# Patient Record
Sex: Female | Born: 2000 | Race: Black or African American | Hispanic: No | Marital: Single | Smoking: Never smoker
Health system: Southern US, Community
[De-identification: ages and names within clinical notes are randomized; demographics above are authoritative.]

## PROBLEM LIST (undated history)

## (undated) ENCOUNTER — Inpatient Hospital Stay (HOSPITAL_COMMUNITY): Payer: Self-pay

## (undated) DIAGNOSIS — Z789 Other specified health status: Secondary | ICD-10-CM

## (undated) HISTORY — DX: Other specified health status: Z78.9

---
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
## 2021-04-18 ENCOUNTER — Other Ambulatory Visit: Payer: Self-pay | Admitting: Internal Medicine

## 2021-04-18 DIAGNOSIS — E049 Nontoxic goiter, unspecified: Secondary | ICD-10-CM

## 2021-05-06 ENCOUNTER — Ambulatory Visit
Admission: RE | Admit: 2021-05-06 | Discharge: 2021-05-06 | Disposition: A | Discharge status: Home / Self Care | Payer: Medicaid Other | Source: Ambulatory Visit | Attending: Internal Medicine | Admitting: Internal Medicine

## 2021-05-06 DIAGNOSIS — E049 Nontoxic goiter, unspecified: Secondary | ICD-10-CM

## 2021-08-10 ENCOUNTER — Emergency Department (HOSPITAL_COMMUNITY): Payer: Medicaid Other

## 2021-08-10 ENCOUNTER — Encounter (HOSPITAL_COMMUNITY): Payer: Self-pay | Admitting: Emergency Medicine

## 2021-08-10 ENCOUNTER — Emergency Department (HOSPITAL_COMMUNITY)
Admission: EM | Admit: 2021-08-10 | Discharge: 2021-08-10 | Disposition: A | Discharge status: Home / Self Care | Payer: Medicaid Other | Attending: Emergency Medicine | Admitting: Emergency Medicine

## 2021-08-10 ENCOUNTER — Other Ambulatory Visit: Payer: Self-pay

## 2021-08-10 DIAGNOSIS — X509XXA Other and unspecified overexertion or strenuous movements or postures, initial encounter: Secondary | ICD-10-CM | POA: Insufficient documentation

## 2021-08-10 DIAGNOSIS — S29011A Strain of muscle and tendon of front wall of thorax, initial encounter: Secondary | ICD-10-CM | POA: Diagnosis not present

## 2021-08-10 DIAGNOSIS — Y9364 Activity, baseball: Secondary | ICD-10-CM | POA: Diagnosis not present

## 2021-08-10 DIAGNOSIS — T148XXA Other injury of unspecified body region, initial encounter: Secondary | ICD-10-CM

## 2021-08-10 DIAGNOSIS — S299XXA Unspecified injury of thorax, initial encounter: Secondary | ICD-10-CM | POA: Diagnosis present

## 2021-08-10 DIAGNOSIS — Y92320 Baseball field as the place of occurrence of the external cause: Secondary | ICD-10-CM | POA: Diagnosis not present

## 2021-08-10 LAB — POC URINE PREG, ED: Preg Test, Ur: NEGATIVE

## 2021-08-10 NOTE — ED Provider Notes (Signed)
?[facility] EMERGENCY DEPARTMENT ?Provider Note ? ? ?CSN: [id number] ?Arrival date & time: 08/10/21  1647 ? ?  ? ?History ? ?Chief Complaint  ?Patient presents with  ? Back Pain  ? ? ?Ariel Golden is a 21 y.o. female. ? ?HPI ?She complains of left lower rib pain present for 2 days, after taking some swings in softball practice.  She also thinks she might of injured her ribs when she jumped and dove to catch a ball last week.  2 days ago she felt a pop as she was swinging. ?  ? ?Home Medications ?Prior to Admission medications   ?Medication Sig Start Date End Date Taking? Authorizing Provider  ?cyclobenzaprine (FLEXERIL) 5 MG tablet Take 1 tablet (5 mg total) by mouth 3 (three) times daily as needed for muscle spasms. ?Patient not taking: Reported on 10/07/2019 01/02/16   Lowanda Foster, NP  ?ibuprofen (ADVIL,MOTRIN) 600 MG tablet Take 1 Tab PO Q6H x 1-2 days then Q6H PRN pain ?Patient not taking: Reported on 10/07/2019 01/02/16   Lowanda Foster, NP  ?   ? ?Allergies    ?Patient has no known allergies.   ? ?Review of Systems   ?Review of Systems ? ?Physical Exam ?Updated Vital Signs ?BP 124/82 (BP Location: Right Arm)   Pulse 84   Temp 98.2 ?F (36.8 ?C)   Resp 14   SpO2 99%  ?Physical Exam ?Vitals and nursing note reviewed.  ?Constitutional:   ?   Appearance: She is well-developed. She is not ill-appearing.  ?HENT:  ?   Head: Normocephalic and atraumatic.  ?   Right Ear: External ear normal.  ?   Left Ear: External ear normal.  ?Eyes:  ?   Conjunctiva/sclera: Conjunctivae normal.  ?   Pupils: Pupils are equal, round, and reactive to light.  ?Neck:  ?   Trachea: Phonation normal.  ?Cardiovascular:  ?   Rate and Rhythm: Normal rate.  ?Pulmonary:  ?   Effort: Pulmonary effort is normal.  ?Chest:  ?   Chest wall: Tenderness (Mild tenderness left rib, #10, without crepitation or deformity.) present.  ?Abdominal:  ?   Palpations: Abdomen is soft.  ?   Tenderness: There is no abdominal tenderness.   ?Musculoskeletal:     ?   General: Normal range of motion.  ?   Cervical back: Normal range of motion and neck supple.  ?   Comments: No tenderness of the lumbar spine or paraspinal muscles in the lumbar region.  ?Skin: ?   General: Skin is warm and dry.  ?Neurological:  ?   Mental Status: She is alert and oriented to person, place, and time.  ?   Cranial Nerves: No cranial nerve deficit.  ?   Sensory: No sensory deficit.  ?   Motor: No abnormal muscle tone.  ?   Coordination: Coordination normal.  ?Psychiatric:     ?   Mood and Affect: Mood normal.     ?   Behavior: Behavior normal.     ?   Thought Content: Thought content normal.     ?   Judgment: Judgment normal.  ? ? ?ED Results / Procedures / Treatments   ?Labs ?(all labs ordered are listed, but only abnormal results are displayed) ?Labs Reviewed  ?POC URINE PREG, ED  ? ? ?EKG ?None ? ?Radiology ?DG Ribs Unilateral W/Chest Left ? ?Result Date: 08/10/2021 ?CLINICAL DATA:  Back pain for 2-3 days on the left, initial encounter EXAM: LEFT RIBS AND  CHEST - 3+ VIEW COMPARISON:  10/07/2019 FINDINGS: Cardiac shadow is within normal limits. The lungs are clear bilaterally. No focal infiltrate, effusion or pneumothorax is seen. No acute rib abnormality noted. IMPRESSION: No acute abnormality noted. Electronically Signed   By: Alcide Clever M.D.   On: 08/10/2021 22:53  ? ?DG Lumbar Spine 2-3 Views ? ?Result Date: 08/10/2021 ?CLINICAL DATA:  Injury, back pain for 2-3 days. EXAM: LUMBAR SPINE - 2-3 VIEW COMPARISON:  None. FINDINGS: There is no evidence of lumbar spine fracture. Alignment is normal. Intervertebral disc spaces are maintained. IMPRESSION: Negative. Electronically Signed   By: Thornell Sartorius M.D.   On: 08/10/2021 22:14   ? ?Procedures ?Procedures  ? ? ?Medications Ordered in ED ?Medications - No data to display ? ?ED Course/ Medical Decision Making/ A&P ?  ?                        ?Medical Decision Making ?Patient presenting with musculoskeletal type pain after 2  injuries, while playing ball.  1 was a diving injury and the other was a sweet injury. ? ?Problems Addressed: ?Muscle strain: undiagnosed new problem with uncertain prognosis ?   Details: Possible fracture, imaging required ? ?Amount and/or Complexity of Data Reviewed ?Independent Historian:  ?   Details: She is a cogent historian ?Radiology: ordered and independent interpretation performed. ?   Details: Chest x-ray with left rib detail, lumbosacral spine-no acute abnormalities ? ?Risk ?Decision regarding hospitalization. ?Risk Details: Patient with minor pain, presenting requiring imaging to evaluate for fractures.  No significant injury noted to chest, left ribs or lumbar spine.  She can be treated symptomatically and expectantly and return to regular activities as tolerated.  No indication for hospitalization at this time. ? ? ? ? ? ? ? ? ? ? ?Final Clinical Impression(s) / ED Diagnoses ?Final diagnoses:  ?Muscle strain  ? ? ?Rx / DC Orders ?ED Discharge Orders   ? ? None  ? ?  ? ? ?  ?Mancel Bale, MD ?08/10/21 2327 ? ?

## 2021-08-10 NOTE — ED Triage Notes (Signed)
Pt c/o back pain x2-3 days, worse after softball practice, thinks she pulled a muscle. States it's spasming, she can't turn or bend down all the way.  ?

## 2021-08-10 NOTE — Discharge Instructions (Signed)
There are no apparent injuries to the ribs or lower back.  You likely have a muscle strain causing the discomfort.  Avoid sporting activities until the pain resolves.  You can try treating the pain with heat application such as a heating pad, 3-4 times a day for 20 minutes.  Also use Tylenol, or Motrin for pain.  See your doctor if not better in a few days. ?

## 2022-05-24 ENCOUNTER — Encounter: Payer: Self-pay | Admitting: General Practice

## 2022-07-14 ENCOUNTER — Encounter: Payer: Medicaid Other | Admitting: Obstetrics and Gynecology

## 2022-08-14 ENCOUNTER — Other Ambulatory Visit (HOSPITAL_COMMUNITY)
Admission: RE | Admit: 2022-08-14 | Discharge: 2022-08-14 | Disposition: A | Discharge status: Home / Self Care | Payer: Medicaid Other | Source: Ambulatory Visit | Attending: Obstetrics and Gynecology | Admitting: Obstetrics and Gynecology

## 2022-08-14 ENCOUNTER — Encounter: Payer: Self-pay | Admitting: Obstetrics and Gynecology

## 2022-08-14 ENCOUNTER — Ambulatory Visit (INDEPENDENT_AMBULATORY_CARE_PROVIDER_SITE_OTHER): Payer: Medicaid Other | Admitting: Obstetrics and Gynecology

## 2022-08-14 VITALS — BP 112/67 | HR 62 | Ht 65.0 in | Wt 178.0 lb

## 2022-08-14 DIAGNOSIS — Z113 Encounter for screening for infections with a predominantly sexual mode of transmission: Secondary | ICD-10-CM

## 2022-08-14 DIAGNOSIS — R87612 Low grade squamous intraepithelial lesion on cytologic smear of cervix (LGSIL): Secondary | ICD-10-CM

## 2022-08-14 NOTE — Progress Notes (Signed)
   NEW GYNECOLOGY PATIENT Patient name: Ariel Golden MRN [id number]  Date of birth: Apr 20, 2001 Chief Complaint:   Abnormal Pap Smear     History:  Ariel Golden is a 22 y.o. . being seen today for abnormal pap smear. No prior pap smear collected. No abnormal symptoms. Previously sexually active. Has a few questions regarding fertility as well - told that many cousins have had issues with abnormal pap smears and fertility.      Gynecologic History No LMP recorded. Contraception: none Last Pap: LSIL  Last Mammogram: n/a Last Colonoscopy: n/a  Obstetric History OB History  No obstetric history on file.    No past medical history on file.  No past surgical history on file.  Current Outpatient Medications on File Prior to Visit  Medication Sig Dispense Refill   cyclobenzaprine (FLEXERIL) 5 MG tablet Take 1 tablet (5 mg total) by mouth 3 (three) times daily as needed for muscle spasms. (Patient not taking: Reported on 10/07/2019) 9 tablet 0   ibuprofen (ADVIL,MOTRIN) 600 MG tablet Take 1 Tab PO Q6H x 1-2 days then Q6H PRN pain (Patient not taking: Reported on 10/07/2019) 30 tablet 0   No current facility-administered medications on file prior to visit.    No Known Allergies  Social History:  reports that she has never smoked. She has never used smokeless tobacco.  No family history on file.  The following portions of the patient's history were reviewed and updated as appropriate: allergies, current medications, past family history, past medical history, past social history, past surgical history and problem list.  Review of Systems Pertinent items noted in HPI and remainder of comprehensive ROS otherwise negative.  Physical Exam:  BP 112/67   Pulse 62   Ht 5\' 5"  (1.651 m)   Wt 178 lb (80.7 kg)   BMI 29.62 kg/m  Physical Exam Vitals and nursing note reviewed.  Constitutional:      Appearance: Normal appearance.  Cardiovascular:     Rate and Rhythm: Normal rate.   Pulmonary:     Effort: Pulmonary effort is normal.     Breath sounds: Normal breath sounds.  Neurological:     General: No focal deficit present.     Mental Status: She is alert and oriented to person, place, and time.  Psychiatric:        Mood and Affect: Mood normal.        Behavior: Behavior normal.        Thought Content: Thought content normal.        Judgment: Judgment normal.        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 Reviewed indication of cervical cancer screening and role of HPV testing.   2. Screening for STD (sexually transmitted disease) Requested STI testing, will follow up results and treat as indicated.  - GC/Chlamydia probe amp ([place])not at [facility] - RPR+HBsAg+HCVAb+...    Routine preventative health maintenance measures emphasized. Please refer to After Visit Summary for other counseling recommendations.   Follow-up: No follow-ups on file.      Ariel Shire, MD Obstetrician & Gynecologist, Faculty Practice [facility] for [employer], [facility]

## 2022-08-15 ENCOUNTER — Other Ambulatory Visit: Payer: Self-pay | Admitting: Obstetrics and Gynecology

## 2022-08-15 ENCOUNTER — Telehealth: Payer: Self-pay

## 2022-08-15 DIAGNOSIS — A749 Chlamydial infection, unspecified: Secondary | ICD-10-CM

## 2022-08-15 LAB — GC/CHLAMYDIA PROBE AMP (~~LOC~~) NOT AT ARMC
Chlamydia: POSITIVE — AB
Comment: NEGATIVE
Comment: NORMAL
Neisseria Gonorrhea: NEGATIVE

## 2022-08-15 LAB — RPR+HBSAG+HCVAB+...
HIV Screen 4th Generation wRfx: NONREACTIVE
Hep C Virus Ab: NONREACTIVE
Hepatitis B Surface Ag: NEGATIVE
RPR Ser Ql: NONREACTIVE

## 2022-08-15 MED ORDER — DOXYCYCLINE HYCLATE 100 MG PO CAPS: 100.0000 mg | ORAL_CAPSULE | Freq: Two times a day (BID) | ORAL | 1 refills | Status: AC

## 2022-08-15 NOTE — Telephone Encounter (Signed)
Patient notified that labwork was all within normal limits. Armandina Stammer RN

## 2022-08-15 NOTE — Telephone Encounter (Signed)
-----   Message from Lorriane Shire, MD sent at 08/15/2022  8:03 AM EDT ----- Notify of negative serum STI tests

## 2022-08-16 ENCOUNTER — Telehealth: Payer: Self-pay

## 2022-08-16 NOTE — Telephone Encounter (Signed)
Attempted to reach patient and voicemailbox not set up. Breona Cherubin RN  ?

## 2022-08-16 NOTE — Telephone Encounter (Signed)
-----   Message from Lorriane Shire, MD sent at 08/15/2022  3:59 PM EDT ----- Notify pt of chlamydia. RX's sent. Advised of need for partner TX and no unprotected sex for 7 days after TX.

## 2022-08-17 NOTE — Telephone Encounter (Signed)
Patient called and verified by name and dob. Patient given results and made aware that she has rx for doxycycline to pick up at her pharmacy. Made aware she should take full regimen and abstain from any intercourse for 14 days from when she starts her treatment. Patient scheduled for TOC in May. Armandina Stammer RN

## 2022-08-20 IMAGING — CR DG RIBS W/ CHEST 3+V*L*
3 series · 3 of 3 positions shown · non-contrast
Comparison: 10/07/2019

CLINICAL DATA: Back pain for 2-3 days on the left, initial
encounter

EXAM:
LEFT RIBS AND CHEST - 3+ VIEW

[chest pa]
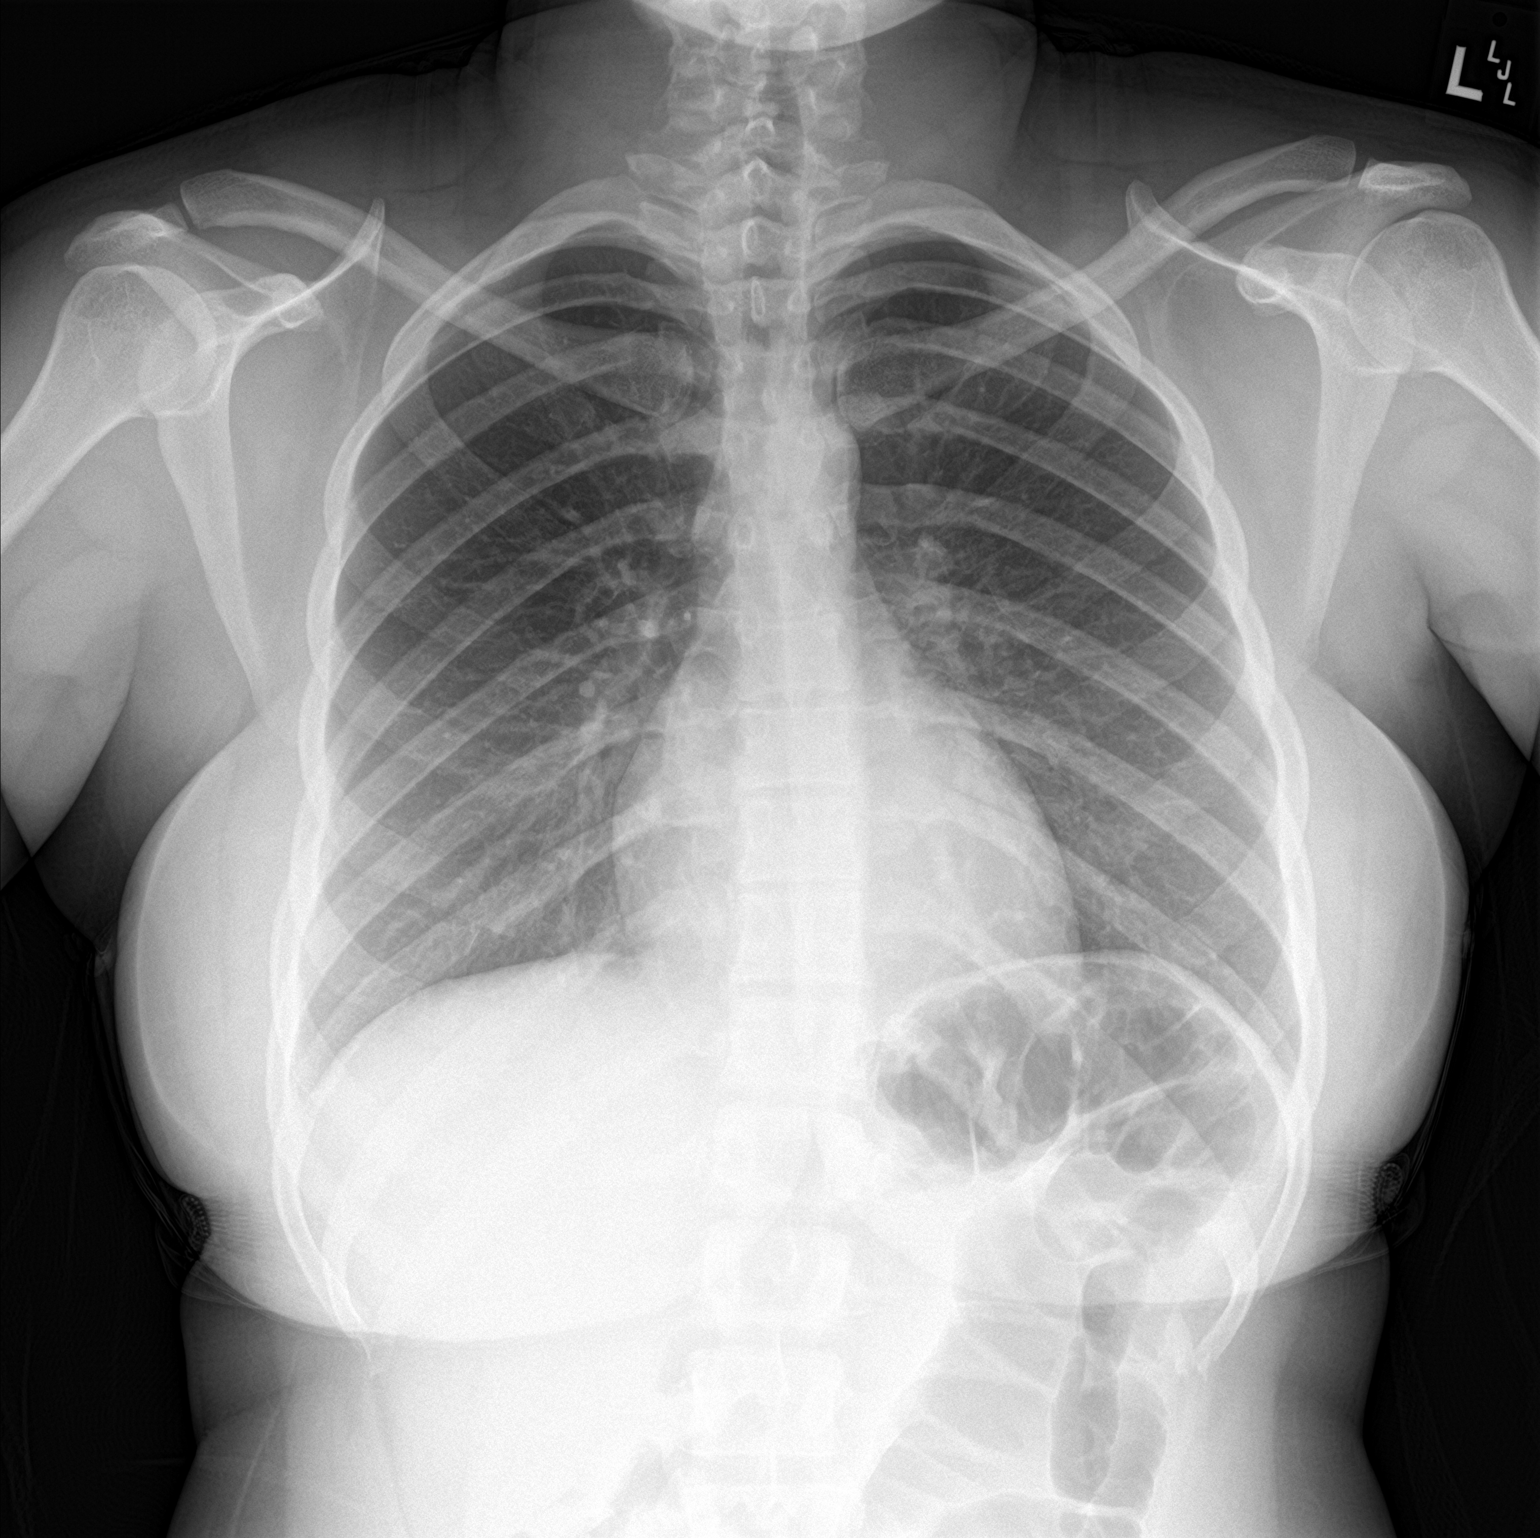

[rib ap]
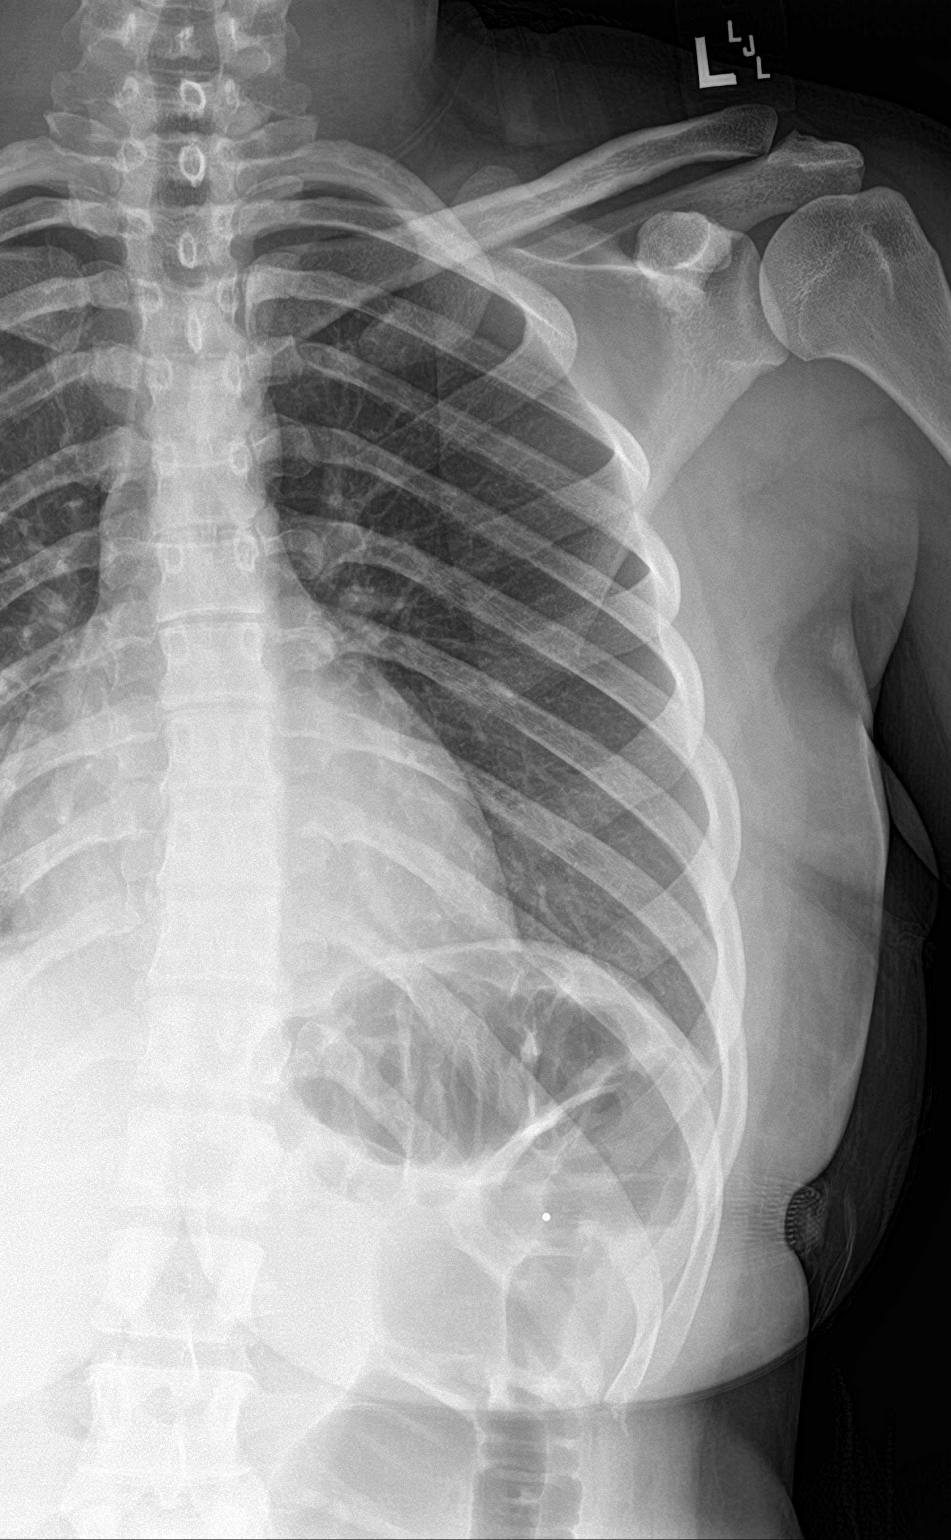

[rib ap obl]
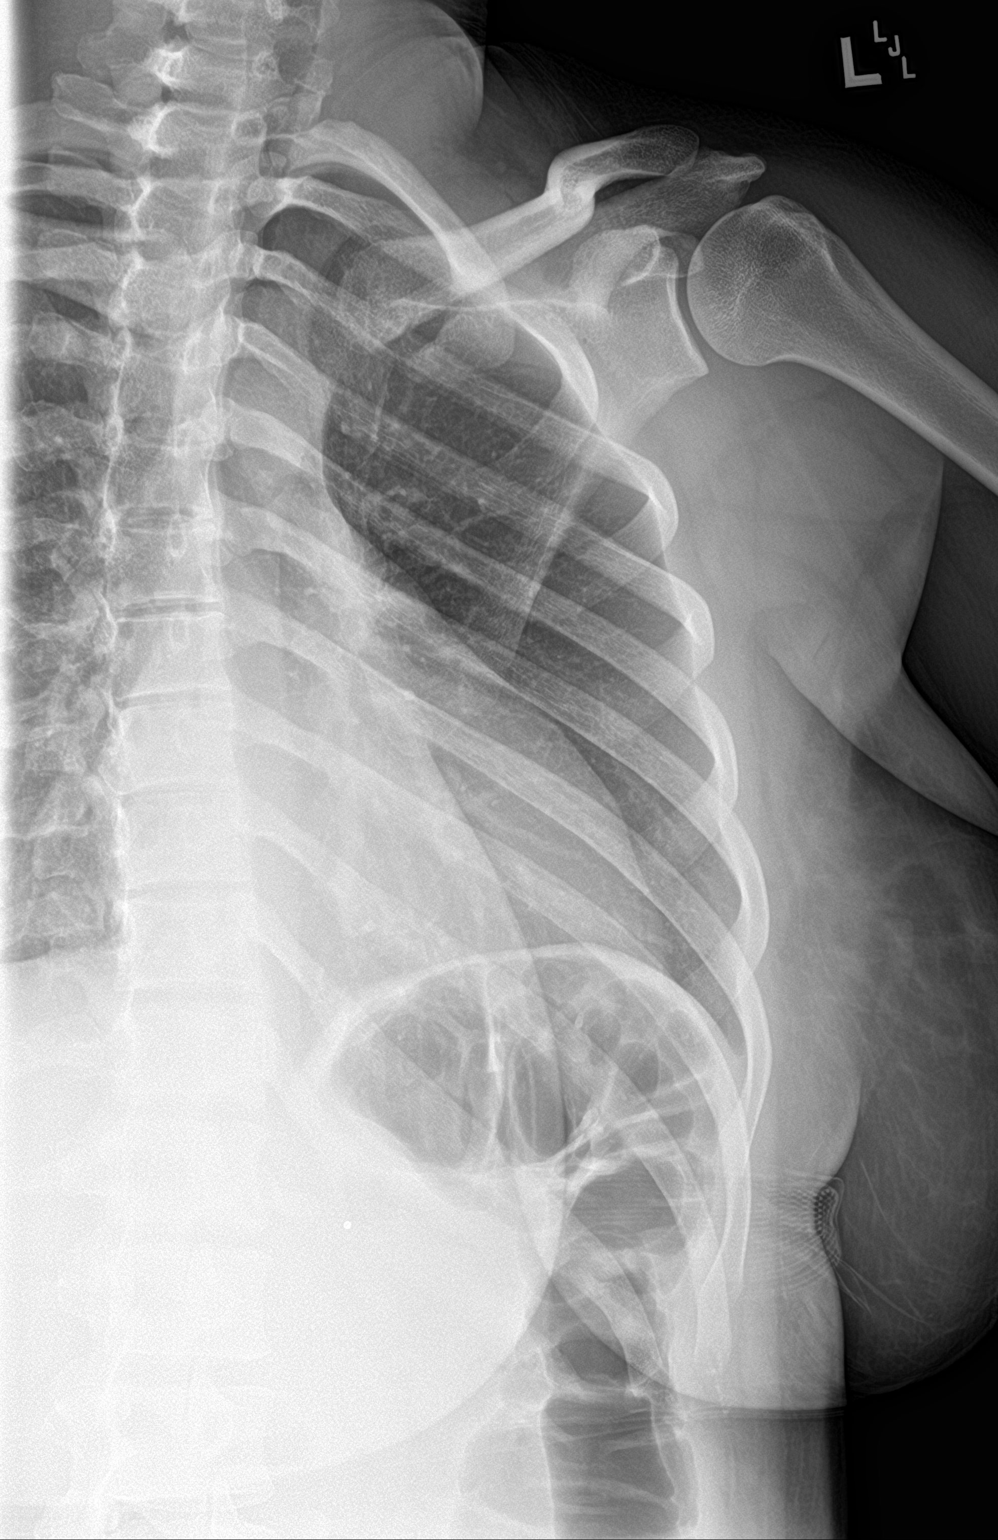

[3 of 3 positions shown; findings below may reference images not displayed]

FINDINGS: Cardiac shadow is within normal limits. The lungs are clear
bilaterally. No focal infiltrate, effusion or pneumothorax is seen.
No acute rib abnormality noted.
IMPRESSION: No acute abnormality noted.

## 2022-09-11 ENCOUNTER — Ambulatory Visit: Payer: Medicaid Other

## 2022-09-18 ENCOUNTER — Ambulatory Visit (INDEPENDENT_AMBULATORY_CARE_PROVIDER_SITE_OTHER): Payer: Medicaid Other

## 2022-09-18 ENCOUNTER — Other Ambulatory Visit (HOSPITAL_COMMUNITY)
Admission: RE | Admit: 2022-09-18 | Discharge: 2022-09-18 | Disposition: A | Discharge status: Home / Self Care | Payer: Medicaid Other | Source: Ambulatory Visit | Attending: Obstetrics and Gynecology | Admitting: Obstetrics and Gynecology

## 2022-09-18 VITALS — BP 117/62 | HR 80 | Ht 65.0 in | Wt 173.0 lb

## 2022-09-18 DIAGNOSIS — Z8619 Personal history of other infectious and parasitic diseases: Secondary | ICD-10-CM | POA: Insufficient documentation

## 2022-09-18 DIAGNOSIS — Z113 Encounter for screening for infections with a predominantly sexual mode of transmission: Secondary | ICD-10-CM

## 2022-09-18 NOTE — Progress Notes (Unsigned)
Patient presents for test of cure from chlamydia. Patient was treated on 08/15/2022. Patient given instructions to collect self swab. Armandina Stammer RN

## 2022-09-19 LAB — CERVICOVAGINAL ANCILLARY ONLY
Bacterial Vaginitis (gardnerella): POSITIVE — AB
Candida Glabrata: NEGATIVE
Candida Vaginitis: NEGATIVE
Chlamydia: NEGATIVE
Comment: NEGATIVE
Comment: NEGATIVE
Comment: NEGATIVE
Comment: NEGATIVE
Comment: NEGATIVE
Comment: NORMAL
Neisseria Gonorrhea: NEGATIVE
Trichomonas: NEGATIVE

## 2022-09-20 ENCOUNTER — Telehealth: Payer: Self-pay

## 2022-09-20 ENCOUNTER — Other Ambulatory Visit: Payer: Self-pay | Admitting: Obstetrics and Gynecology

## 2022-09-20 DIAGNOSIS — B9689 Other specified bacterial agents as the cause of diseases classified elsewhere: Secondary | ICD-10-CM

## 2022-09-20 MED ORDER — METRONIDAZOLE 500 MG PO TABS: 500.0000 mg | ORAL_TABLET | Freq: Two times a day (BID) | ORAL | 0 refills | Status: AC

## 2022-09-20 NOTE — Telephone Encounter (Signed)
Called patient to inform her that she tested positive for BV and all blood work and GC/Chlamydia labs are negative. Pt also made aware that Flagyl 500 mg 1 tablet PO BID was sent to her pharmacy. Advised patient to drink while taking this medication because it can make her sick. Understanding was voiced. Suzanne Kho l Viren Lebeau, CMA

## 2022-11-13 ENCOUNTER — Other Ambulatory Visit (HOSPITAL_COMMUNITY)
Admission: RE | Admit: 2022-11-13 | Discharge: 2022-11-13 | Disposition: A | Discharge status: Home / Self Care | Payer: Medicaid Other | Source: Ambulatory Visit | Attending: Obstetrics and Gynecology | Admitting: Obstetrics and Gynecology

## 2022-11-13 ENCOUNTER — Ambulatory Visit: Payer: Medicaid Other | Admitting: Obstetrics and Gynecology

## 2022-11-13 ENCOUNTER — Encounter: Payer: Self-pay | Admitting: Obstetrics and Gynecology

## 2022-11-13 VITALS — BP 116/60 | HR 67 | Wt 186.0 lb

## 2022-11-13 DIAGNOSIS — Z113 Encounter for screening for infections with a predominantly sexual mode of transmission: Secondary | ICD-10-CM | POA: Diagnosis present

## 2022-11-13 DIAGNOSIS — N946 Dysmenorrhea, unspecified: Secondary | ICD-10-CM | POA: Diagnosis not present

## 2022-11-13 NOTE — Progress Notes (Signed)
    GYNECOLOGY VISIT  Patient name: Ariel Golden MRN [id number]  Date of birth: 2001/02/20 Chief Complaint:   Dysmenorrhea  History:  Ariel Golden is a 22 y.o. No obstetric history on file. being seen today for worsened menstrual pain. Last cycle had more significant than usual. Had not taken her advil that day since it was during the last 2 days of her cycle and typically doesn't have pain at that point. 30 - 40 minutes of pain that was like a stabbing pain and she was recommended by mother to be evaluated. No continued pain since menses has ceased.  No dysuria, no dyschezia, no pain outside of cycles, not currently sexually active. May have to miss class when not feeling well. Not intersted in OCP at this point. Typically has improvement of pain with advil.   No past medical history on file.  No past surgical history on file.  The following portions of the patient's history were reviewed and updated as appropriate: allergies, current medications, past family history, past medical history, past social history, past surgical history and problem list.   Health Maintenance:   Last pap 03/2022 LSIL(recommended repeat in 1 year) Last mammogram: n/a   Review of Systems:  Pertinent items are noted in HPI. Comprehensive review of systems was otherwise negative.   Objective:  Physical Exam BP 116/60   Pulse 67   Wt 186 lb (84.4 kg)   LMP 11/10/2022 (Exact Date)   BMI 30.95 kg/m    Physical Exam Vitals and nursing note reviewed.  Constitutional:      Appearance: Normal appearance.  HENT:     Head: Normocephalic and atraumatic.  Pulmonary:     Effort: Pulmonary effort is normal.  Abdominal:     Palpations: Abdomen is soft.     Tenderness: There is no abdominal tenderness.  Skin:    General: Skin is warm and dry.  Neurological:     General: No focal deficit present.     Mental Status: She is alert.  Psychiatric:        Mood and Affect: Mood normal.        Behavior:  Behavior normal.        Thought Content: Thought content normal.        Judgment: Judgment normal.       Assessment & Plan:   1. Dysmenorrhea Suspect primary dysmenorrhea. Discussed TVUS to evaluate for structural contribution to pain including cysts as well as to have baseline [place] on file. Offered menstrual suppression, declines at this time. Discussed use of NSAIDs scheduled starting day prior to onset of menses to help decrease pain.  - US PELVIC COMPLETE WITH TRANSVAGINAL; Future  2. Screening examination for STI Prior swab with BV and requested full repeat testing today.  - Cervicovaginal ancillary only - RPR+HBsAg+HCVAb+...   Routine preventative health maintenance measures emphasized.  Ariel Shire, MD [facility] for [facility], [facility]

## 2022-11-14 LAB — CERVICOVAGINAL ANCILLARY ONLY
Bacterial Vaginitis (gardnerella): POSITIVE — AB
Candida Glabrata: NEGATIVE
Candida Vaginitis: NEGATIVE
Chlamydia: NEGATIVE
Comment: NEGATIVE
Comment: NEGATIVE
Comment: NEGATIVE
Comment: NEGATIVE
Comment: NEGATIVE
Comment: NORMAL
Neisseria Gonorrhea: NEGATIVE
Trichomonas: NEGATIVE

## 2022-11-14 LAB — RPR+HBSAG+HCVAB+...
HIV Screen 4th Generation wRfx: NONREACTIVE
Hep C Virus Ab: NONREACTIVE
Hepatitis B Surface Ag: NEGATIVE
RPR Ser Ql: NONREACTIVE

## 2022-11-16 ENCOUNTER — Telehealth: Payer: Self-pay

## 2022-11-16 ENCOUNTER — Ambulatory Visit (HOSPITAL_BASED_OUTPATIENT_CLINIC_OR_DEPARTMENT_OTHER)
Admission: RE | Admit: 2022-11-16 | Discharge: 2022-11-16 | Disposition: A | Discharge status: Home / Self Care | Payer: Medicaid Other | Source: Ambulatory Visit | Attending: Obstetrics and Gynecology | Admitting: Obstetrics and Gynecology

## 2022-11-16 ENCOUNTER — Other Ambulatory Visit: Payer: Self-pay | Admitting: Obstetrics and Gynecology

## 2022-11-16 DIAGNOSIS — N946 Dysmenorrhea, unspecified: Secondary | ICD-10-CM | POA: Insufficient documentation

## 2022-11-16 DIAGNOSIS — B9689 Other specified bacterial agents as the cause of diseases classified elsewhere: Secondary | ICD-10-CM

## 2022-11-16 MED ORDER — METRONIDAZOLE 0.75 % VA GEL: 1.0000 | Freq: Every day | VAGINAL | 1 refills | Status: DC

## 2022-11-16 NOTE — Telephone Encounter (Signed)
-----   Message from Lorriane Shire sent at 11/16/2022  1:35 PM EDT ----- Notify of normal ultrasound and blood work. Vaginal swab shows persistent BV and metrogel sent to pharmacy

## 2022-11-16 NOTE — Telephone Encounter (Signed)
Called patient to inform her that her [place] and blood work is normal. She tested positive for BV and Metrogel was sent to her pharmacy. Understanding was voiced. Myndi Wamble l Jamaree Hosier, CMA

## 2023-04-20 ENCOUNTER — Encounter: Payer: Self-pay | Admitting: Obstetrics and Gynecology

## 2023-04-20 ENCOUNTER — Other Ambulatory Visit (HOSPITAL_COMMUNITY)
Admission: RE | Admit: 2023-04-20 | Discharge: 2023-04-20 | Disposition: A | Discharge status: Home / Self Care | Payer: Medicaid Other | Source: Ambulatory Visit | Attending: Obstetrics and Gynecology | Admitting: Obstetrics and Gynecology

## 2023-04-20 ENCOUNTER — Ambulatory Visit: Payer: Medicaid Other | Admitting: Obstetrics and Gynecology

## 2023-04-20 ENCOUNTER — Ambulatory Visit (INDEPENDENT_AMBULATORY_CARE_PROVIDER_SITE_OTHER): Payer: Medicaid Other

## 2023-04-20 VITALS — BP 128/69 | HR 77 | Ht 65.0 in | Wt 182.0 lb

## 2023-04-20 DIAGNOSIS — Z113 Encounter for screening for infections with a predominantly sexual mode of transmission: Secondary | ICD-10-CM | POA: Diagnosis present

## 2023-04-20 NOTE — Progress Notes (Signed)
SUBJECTIVE:  22 y.o. female who desires a STI screen. Denies abnormal vaginal discharge, bleeding or significant pelvic pain. No UTI symptoms. Denies history of known exposure to STD.  Patient's last menstrual period was 04/02/2023 (approximate).  OBJECTIVE:  She appears well.   ASSESSMENT:  STI Screen   PLAN:  Pt offered STI blood screening-requested GC, chlamydia, and trichomonas probe sent to lab.  Treatment: To be determined once lab results are received.  Pt follow up as needed.

## 2023-04-23 NOTE — Progress Notes (Signed)
Cancelled appointment

## 2023-04-24 LAB — CERVICOVAGINAL ANCILLARY ONLY
Chlamydia: NEGATIVE
Comment: NEGATIVE
Comment: NEGATIVE
Comment: NORMAL
Neisseria Gonorrhea: NEGATIVE
Trichomonas: NEGATIVE

## 2023-06-12 ENCOUNTER — Ambulatory Visit: Payer: Medicaid Other

## 2023-06-12 ENCOUNTER — Other Ambulatory Visit (HOSPITAL_COMMUNITY)
Admission: RE | Admit: 2023-06-12 | Discharge: 2023-06-12 | Disposition: A | Discharge status: Home / Self Care | Payer: Medicaid Other | Source: Ambulatory Visit

## 2023-06-12 VITALS — BP 126/72 | HR 72 | Ht 65.0 in | Wt 189.0 lb

## 2023-06-12 DIAGNOSIS — Z1239 Encounter for other screening for malignant neoplasm of breast: Secondary | ICD-10-CM

## 2023-06-12 DIAGNOSIS — Z1151 Encounter for screening for human papillomavirus (HPV): Secondary | ICD-10-CM

## 2023-06-12 DIAGNOSIS — R300 Dysuria: Secondary | ICD-10-CM

## 2023-06-12 DIAGNOSIS — Z113 Encounter for screening for infections with a predominantly sexual mode of transmission: Secondary | ICD-10-CM | POA: Diagnosis not present

## 2023-06-12 DIAGNOSIS — Z124 Encounter for screening for malignant neoplasm of cervix: Secondary | ICD-10-CM

## 2023-06-12 DIAGNOSIS — Z01419 Encounter for gynecological examination (general) (routine) without abnormal findings: Secondary | ICD-10-CM | POA: Diagnosis present

## 2023-06-12 DIAGNOSIS — R87612 Low grade squamous intraepithelial lesion on cytologic smear of cervix (LGSIL): Secondary | ICD-10-CM

## 2023-06-12 LAB — POCT URINALYSIS DIPSTICK

## 2023-06-12 NOTE — Progress Notes (Signed)
GYNECOLOGY OFFICE VISIT NOTE-WELL WOMAN EXAM  History:   Ariel Golden is a 23 year old here today for "pap smear and annual."    LMP was May 31, 2023. She reports normal, for her is, "heavy cramping and heavy bleeding that dies down the last 2-3 days."  She reports periods lasts "full 7 days."  She reports passing some clots "thumb nail" sized clots.  She reports taking Advil for the cramping with relief. She denies any abnormal vaginal discharge, bleeding, or pelvic pain.   Birth Control:  None. Not desired currently  Reproductive Concerns Sexually Active: Not currently. Last incident October Partners Type: Female Number of partners in last year: Two; Condom usage. Reports some condom breakage STD Testing: Full  Obstetrical History: No obstetric history on file.  Gynecological History: She reports having abnormal pap smear in Dec 2023. She is unsure if she received follow up. Vaginal/GU Concerns: She denies issues with urination, constipation, or diarrhea. However, she does report burning with urination that is worse during her menstrual cycle, but always present. She reports symptoms have been present since December.  Breast Concerns/Exams: She checks breast regularly and endorses SBA. She reports maternal cousin is breast CA survivor. She denies other family history of breast, uterine, cervical, or ovarian cancer  Medical and Nutrition PCP: [facility]-Last appt last month Significant PMx: Anemia Exercise: Daily Walking 1.5 hrs. Attempts to do 3 miles.  Tobacco/Drugs/Alcohol/Vaping: Drinks "every once in a blue moon." Usually mixed drinks.  Nutrition: Attempts to eat balanced  Social Safety at home: Lives with mom, aunt, grandma, and female cousin. Endorses safety.  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  No past surgical  history on file.  The following portions of the patient's history were reviewed and updated as appropriate: allergies, current medications, past family history, past medical history, past social history, past surgical history and problem list.   Health Maintenance: Pap: Pending .  Mammogram: N/A.  Colonoscopy: N/A Review of Systems:  Pertinent items noted in HPI and remainder of comprehensive ROS otherwise negative.    Objective:    Physical Exam BP 126/72   Pulse 72   Ht 5\' 5"  (1.651 m)   Wt 189 lb (85.7 kg)   LMP 05/31/2023   BMI 31.45 kg/m  Physical Exam Vitals reviewed. Exam conducted with a chaperone present [place], [place]).  Constitutional:      Appearance: Normal appearance.  HENT:     Head: Normocephalic and atraumatic.  Eyes:     Conjunctiva/sclera: Conjunctivae normal.  Cardiovascular:     Rate and Rhythm: Normal rate and regular rhythm.     Heart sounds: Normal heart sounds.  Pulmonary:     Effort: Pulmonary effort is normal. No respiratory distress.     Breath sounds: Normal breath sounds.  Chest:  Breasts:    Right: No mass, nipple discharge, skin change or tenderness.     Left: No mass, nipple discharge, skin change or tenderness.     Comments: CBE completed and normal Abdominal:     General: Bowel sounds are normal.     Palpations: Abdomen is soft.     Tenderness: There is no abdominal tenderness.  Genitourinary:    General: Normal vulva.     Labia:        Right: No tenderness or lesion.        Left: No  tenderness or lesion.      Vagina: Vaginal discharge (Milky white, ? curdy consistenty) present.     Cervix: Discharge present.     Uterus: Normal. Not enlarged and not tender.      Comments: Pap collected with broom and spatula. Cervix with some scant bleeding/mucoid discharge after collection.  No apparent uterine tenderness or enlargement. Adnexa not appreciated.  Musculoskeletal:        General: Normal range of motion.     Cervical back: Normal  range of motion.  Skin:    General: Skin is warm and dry.     Comments: Tattoos noted  Neurological:     Mental Status: She is alert and oriented to person, place, and time.  Psychiatric:        Mood and Affect: Mood normal.        Behavior: Behavior normal.      Labs and Imaging No results found for this or any previous visit (from the past week). No results found.   Assessment & Plan:  23 year old Female Well Woman Exam with Pap STD Testing Dysuria  1. Well woman exam with routine gynecological exam -Exam performed and findings discussed. -Encouraged to activate and utilize Mychart for reviewing of results, communication with office, and scheduling of appts. -Encouraged to continue exercise regimen.  Discussed adding strength training for muscle strength/support.    2. Pap smear for cervical cancer screening -History of LSIL. -Pap collected today. -Educated on ASCCP guidelines regarding pap smear evaluation and frequency. -Discussed how management will be based on pending results. -Informed of turnover time and provider/clinic policy on releasing results.  3. Screening examination for STD (sexually transmitted disease) -CV collected. -Will treat accordingly.   4. Burning with urination -Urine dip negative. -Will send for culture to r/o bacteruria.   5. Screening breast examination -CBE completed and normal. -Educated and encouraged to continue SBE with increased breast awareness including examination of breast for skin changes, moles, tenderness, etc.  -Information regarding breast awareness placed in AVS. -Encouraged to contact office with changes or concerns.    Routine preventative health maintenance measures emphasized. Please refer to After Visit Summary for other counseling recommendations.   No follow-ups on file.      Cherre Robins, CNM 06/12/2023

## 2023-06-13 LAB — CERVICOVAGINAL ANCILLARY ONLY
Bacterial Vaginitis (gardnerella): NEGATIVE
Candida Glabrata: NEGATIVE
Candida Vaginitis: NEGATIVE
Chlamydia: NEGATIVE
Comment: NEGATIVE
Comment: NEGATIVE
Comment: NEGATIVE
Comment: NEGATIVE
Comment: NEGATIVE
Comment: NORMAL
Neisseria Gonorrhea: NEGATIVE
Trichomonas: NEGATIVE

## 2023-06-13 LAB — RPR+HBSAG+HCVAB+...
HIV Screen 4th Generation wRfx: NONREACTIVE
Hep C Virus Ab: NONREACTIVE
Hepatitis B Surface Ag: NEGATIVE
RPR Ser Ql: NONREACTIVE

## 2023-06-14 LAB — URINE CULTURE

## 2023-06-15 LAB — CYTOLOGY - PAP

## 2023-11-26 ENCOUNTER — Encounter

## 2023-11-27 ENCOUNTER — Inpatient Hospital Stay (HOSPITAL_COMMUNITY)

## 2023-11-27 ENCOUNTER — Encounter (HOSPITAL_COMMUNITY): Payer: Self-pay | Admitting: *Deleted

## 2023-11-27 ENCOUNTER — Inpatient Hospital Stay (HOSPITAL_COMMUNITY)
Admission: AD | Admit: 2023-11-27 | Discharge: 2023-11-27 | Disposition: A | Discharge status: Home / Self Care | Attending: Obstetrics and Gynecology | Admitting: Obstetrics and Gynecology

## 2023-11-27 ENCOUNTER — Other Ambulatory Visit: Payer: Self-pay

## 2023-11-27 DIAGNOSIS — R1030 Lower abdominal pain, unspecified: Secondary | ICD-10-CM | POA: Insufficient documentation

## 2023-11-27 DIAGNOSIS — O418X10 Other specified disorders of amniotic fluid and membranes, first trimester, not applicable or unspecified: Secondary | ICD-10-CM

## 2023-11-27 DIAGNOSIS — M549 Dorsalgia, unspecified: Secondary | ICD-10-CM | POA: Diagnosis not present

## 2023-11-27 DIAGNOSIS — Z3A01 Less than 8 weeks gestation of pregnancy: Secondary | ICD-10-CM | POA: Diagnosis not present

## 2023-11-27 DIAGNOSIS — O208 Other hemorrhage in early pregnancy: Secondary | ICD-10-CM | POA: Diagnosis not present

## 2023-11-27 DIAGNOSIS — O99891 Other specified diseases and conditions complicating pregnancy: Secondary | ICD-10-CM

## 2023-11-27 DIAGNOSIS — Z3491 Encounter for supervision of normal pregnancy, unspecified, first trimester: Secondary | ICD-10-CM

## 2023-11-27 DIAGNOSIS — O219 Vomiting of pregnancy, unspecified: Secondary | ICD-10-CM | POA: Diagnosis not present

## 2023-11-27 DIAGNOSIS — R112 Nausea with vomiting, unspecified: Secondary | ICD-10-CM

## 2023-11-27 DIAGNOSIS — O26891 Other specified pregnancy related conditions, first trimester: Secondary | ICD-10-CM | POA: Insufficient documentation

## 2023-11-27 LAB — CBC
HCT: 44.7 % (ref 36.0–46.0)
Hemoglobin: 14.5 g/dL (ref 12.0–15.0)
MCH: 28 pg (ref 26.0–34.0)
MCHC: 32.4 g/dL (ref 30.0–36.0)
MCV: 86.5 fL (ref 80.0–100.0)
Platelets: 343 K/uL (ref 150–400)
RBC: 5.17 MIL/uL — ABNORMAL HIGH (ref 3.87–5.11)
RDW: 13 % (ref 11.5–15.5)
WBC: 7.4 K/uL (ref 4.0–10.5)
nRBC: 0 % (ref 0.0–0.2)

## 2023-11-27 LAB — WET PREP, GENITAL
Clue Cells Wet Prep HPF POC: NONE SEEN
Sperm: NONE SEEN
Trich, Wet Prep: NONE SEEN
WBC, Wet Prep HPF POC: <10 (ref <10)
Yeast Wet Prep HPF POC: NONE SEEN

## 2023-11-27 LAB — URINALYSIS, ROUTINE W REFLEX MICROSCOPIC
Bilirubin Urine: NEGATIVE
Glucose, UA: NEGATIVE mg/dL
Hgb urine dipstick: NEGATIVE
Ketones, ur: NEGATIVE mg/dL
Leukocytes,Ua: NEGATIVE
Nitrite: NEGATIVE
Protein, ur: NEGATIVE mg/dL
Specific Gravity, Urine: 1.021 (ref 1.005–1.030)
pH: 7 (ref 5.0–8.0)

## 2023-11-27 LAB — ABO/RH: ABO/RH(D): O POS

## 2023-11-27 LAB — POCT PREGNANCY, URINE: Preg Test, Ur: POSITIVE — AB

## 2023-11-27 LAB — HCG, QUANTITATIVE, PREGNANCY: hCG, Beta Chain, Quant, S: 102534 m[IU]/mL — ABNORMAL HIGH (ref <5)

## 2023-11-27 MED ORDER — PRENATAL COMPLETE 14-0.4 MG PO TABS
1.0000 | ORAL_TABLET | Freq: Every day | ORAL | 3 refills | Status: DC
Start: 2023-11-27 — End: 2024-02-05

## 2023-11-27 MED ORDER — ONDANSETRON HCL 4 MG PO TABS: 8.0000 mg | ORAL_TABLET | Freq: Two times a day (BID) | ORAL | 0 refills | Status: DC

## 2023-11-27 MED ORDER — SCOPOLAMINE 1 MG/3DAYS TD PT72: 1.0000 | MEDICATED_PATCH | TRANSDERMAL | 12 refills | Status: DC

## 2023-11-27 NOTE — MAU Note (Signed)
 Ariel Golden is a 24 y.o. at Unknown here in MAU reporting: she's having both lower abdominal and back pain that began one month ago.  Denies VB.  Also c/o fatigue and nausea.  LMP: May 2025 Onset of complaint: one month Pain score: 8 Vitals:   11/27/23 1023  BP: 119/67  Pulse: 85  Resp: 17  Temp: 98.4 F (36.9 C)  SpO2: 99%     FHT: NA  Lab orders placed from triage: UPT & UA

## 2023-11-27 NOTE — MAU Provider Note (Signed)
 S Ms. Ariel Golden is a 23 y.o. G1P0 patient who presents to MAU today with complaint of having lower abdominal and back pain that began approximately 1 month ago patient also reports complaints of fatigue and nausea.  She has had a positive UPT with unknown LMP approximately May 2025.  She denies heavy vaginal bleeding    O BP 119/67 (BP Location: Right Arm)   Pulse 85   Temp 98.4 F (36.9 C) (Oral)   Resp 17   Ht 5' 5 (1.651 m)   Wt 82.7 kg   LMP  (Approximate) Comment: May 2025  SpO2 99%   BMI 30.35 kg/m  Physical Exam Vitals and nursing note reviewed. Exam conducted with a chaperone present.  Constitutional:      General: She is not in acute distress.    Appearance: She is well-developed. She is obese. She is not ill-appearing.  HENT:     Head: Normocephalic.     Nose: Nose normal.     Mouth/Throat:     Mouth: Mucous membranes are moist.  Cardiovascular:     Rate and Rhythm: Normal rate.  Pulmonary:     Effort: Pulmonary effort is normal.  Abdominal:     Palpations: Abdomen is soft.     Tenderness: There is no abdominal tenderness.  Musculoskeletal:        General: Normal range of motion.     Cervical back: Normal range of motion.  Skin:    General: Skin is warm.  Neurological:     Mental Status: She is alert and oriented to person, place, and time.  Psychiatric:        Mood and Affect: Mood normal.        Behavior: Behavior normal.     MDM  Vaginal bleeding/ or abdominal cramping  in early pregnacy CBC: Remarkable HCG Quant: 102,534 ABO: O Positive OB Ultrasound: SIUP visualized with a gestational sac, yolk sac, embryo, cardiac activity visualized.  There is also a small subchorionic hemorrhage noted Vaginal Swabs: Negative, GC pend at discharge UA: unremarkable    Differential diagnosis considered for 1st trimester vaginal bleeding includes but is not limited to: ectopic pregnancy, complete spontaneous abortion, incomplete abortion, missed  abortion, threatened abortion, embryonic/fetal demise, cervical insufficiency, cervical or vaginal disorder    HIGH  Orders Placed This Encounter  Procedures   Wet prep, genital    Standing Status:   Standing    Number of Occurrences:   1   US  OB LESS THAN 14 WEEKS WITH OB TRANSVAGINAL    Standing Status:   Standing    Number of Occurrences:   1    Symptom/Reason for Exam:   Abdominal pain affecting pregnancy [[id number]]   Urinalysis, Routine w reflex microscopic -Urine, Clean Catch    Standing Status:   Standing    Number of Occurrences:   1    Specimen Source:   Urine, Clean Catch [[id number]]   CBC    Standing Status:   Standing    Number of Occurrences:   1   hCG, quantitative, pregnancy    Standing Status:   Standing    Number of Occurrences:   1   Pregnancy, urine POC    Standing Status:   Standing    Number of Occurrences:   1   ABO/Rh    Standing Status:   Standing    Number of Occurrences:   1   Discharge patient Discharge disposition: 01-Home or Self Care;  Discharge patient date: 11/27/2023    Standing Status:   Standing    Number of Occurrences:   1    Discharge disposition:   01-Home or Self Care [1]    Discharge patient date:   11/27/2023      Results for orders placed or performed during the hospital encounter of 11/27/23 (from the past 24 hours)  Pregnancy, urine POC     Status: Abnormal   Collection Time: 11/27/23  9:59 AM  Result Value Ref Range   Preg Test, Ur POSITIVE (A) NEGATIVE  Urinalysis, Routine w reflex microscopic -Urine, Clean Catch     Status: Abnormal   Collection Time: 11/27/23 10:13 AM  Result Value Ref Range   Color, Urine YELLOW YELLOW   APPearance HAZY (A) CLEAR   Specific Gravity, Urine 1.021 1.005 - 1.030   pH 7.0 5.0 - 8.0   Glucose, UA NEGATIVE NEGATIVE mg/dL   Hgb urine dipstick NEGATIVE NEGATIVE   Bilirubin Urine NEGATIVE NEGATIVE   Ketones, ur NEGATIVE NEGATIVE mg/dL   Protein, ur NEGATIVE NEGATIVE mg/dL   Nitrite NEGATIVE NEGATIVE    Leukocytes,Ua NEGATIVE NEGATIVE  Wet prep, genital     Status: None   Collection Time: 11/27/23 10:24 AM   Specimen: PATH Cytology Cervicovaginal Ancillary Only  Result Value Ref Range   Yeast Wet Prep HPF POC NONE SEEN NONE SEEN   Trich, Wet Prep NONE SEEN NONE SEEN   Clue Cells Wet Prep HPF POC NONE SEEN NONE SEEN   WBC, Wet Prep HPF POC <10 <10   Sperm NONE SEEN   ABO/Rh     Status: None   Collection Time: 11/27/23 12:01 PM  Result Value Ref Range   ABO/RH(D) O POS    No rh immune globuloin      NOT A RH IMMUNE GLOBULIN CANDIDATE, PT RH POSITIVE Performed at [facility], [address] [place], [place] [address]   CBC     Status: Abnormal   Collection Time: 11/27/23 12:04 PM  Result Value Ref Range   WBC 7.4 4.0 - 10.5 K/uL   RBC 5.17 (H) 3.87 - 5.11 MIL/uL   Hemoglobin 14.5 12.0 - 15.0 g/dL   HCT 55.2 63.9 - 53.9 %   MCV 86.5 80.0 - 100.0 fL   MCH 28.0 26.0 - 34.0 pg   MCHC 32.4 30.0 - 36.0 g/dL   RDW 86.9 88.4 - 84.4 %   Platelets 343 150 - 400 K/uL   nRBC 0.0 0.0 - 0.2 %  hCG, quantitative, pregnancy     Status: Abnormal   Collection Time: 11/27/23 12:04 PM  Result Value Ref Range   hCG, Beta Chain, Quant, S 102,534 (H) <5 mIU/mL     Study Result  Narrative & Impression  CLINICAL DATA:  Abdominal pain.  Pregnant   EXAM: OBSTETRIC <14 WK US  AND TRANSVAGINAL OB US    TECHNIQUE: Both transabdominal and transvaginal ultrasound examinations were performed for complete evaluation of the gestation as well as the maternal uterus, adnexal regions, and pelvic cul-de-sac. Transvaginal technique was performed to assess early pregnancy.   COMPARISON:  Ultrasound 11/16/22   FINDINGS: Intrauterine gestational sac: Single   Yolk sac:  Visualized.   Embryo:  Visualized.   Cardiac Activity: Visualized.   Heart Rate: 154 bpm   CRL:  12.0 mm   7 w   3 d                  US  EDC: 07/12/2024  Subchorionic hemorrhage:  Small   Maternal uterus/adnexae:  Maternal right ovary is not well seen with overlapping bowel gas and soft tissue. The left has small follicles and measures 2.5 x 3.4 x 2.6 cm. No significant free fluid in the pelvis.   IMPRESSION: Single live intrauterine pregnancy of approximately 7 weeks and 3 days with positive fetal heart motion. Small subchronic hemorrhage.   Poor visualization of the right ovary.     Electronically Signed   By: Ranell Bring M.D.   On: 11/27/2023 13:25      I have reviewed the patient chart and performed the physical exam . I have ordered & interpreted the lab results and reviewed and interpreted the Ultrasound images and agree with the radiologist findings Medications ordered as stated below.  A/P as described below.  Counseling and education provided and patient agreeable  with plan as described below. Verbalized understanding.    ASSESSMENT Medical screening exam complete 1. Back pain affecting pregnancy in first trimester (Primary)  2. Nausea and vomiting, unspecified vomiting type  3. Normal intrauterine pregnancy on prenatal ultrasound in first trimester  4. Subchorionic hematoma in first trimester, single or unspecified fetus     PLAN Future Appointments  Date Time Provider Department Center  12/11/2023  2:00 PM [facility] NURSE [facility] None    Discharge from MAU in stable condition  Establish Prenatal Care   See AVS for full description of educational information and instructions provided to the patient at time of discharge  Warning signs for worsening condition that would warrant emergency follow-up discussed Patient may return to MAU as needed   Littie Olam LABOR, NP 11/27/2023 7:42 PM

## 2023-11-28 LAB — GC/CHLAMYDIA PROBE AMP (~~LOC~~) NOT AT ARMC
Chlamydia: NEGATIVE
Comment: NEGATIVE
Comment: NORMAL
Neisseria Gonorrhea: NEGATIVE

## 2023-12-06 ENCOUNTER — Other Ambulatory Visit: Payer: Self-pay

## 2023-12-06 DIAGNOSIS — O219 Vomiting of pregnancy, unspecified: Secondary | ICD-10-CM

## 2023-12-06 MED ORDER — PROMETHAZINE HCL 25 MG PO TABS: 25.0000 mg | ORAL_TABLET | Freq: Four times a day (QID) | ORAL | 2 refills | Status: DC | PRN

## 2023-12-11 ENCOUNTER — Ambulatory Visit (INDEPENDENT_AMBULATORY_CARE_PROVIDER_SITE_OTHER)

## 2023-12-11 ENCOUNTER — Other Ambulatory Visit: Payer: Self-pay

## 2023-12-11 DIAGNOSIS — Z3400 Encounter for supervision of normal first pregnancy, unspecified trimester: Secondary | ICD-10-CM | POA: Insufficient documentation

## 2023-12-11 DIAGNOSIS — Z3401 Encounter for supervision of normal first pregnancy, first trimester: Secondary | ICD-10-CM | POA: Diagnosis not present

## 2023-12-11 NOTE — Progress Notes (Signed)
 New OB Intake  I explained I am completing New OB Intake today. We discussed EDD of 07/12/2024, by Ultrasound. Pt is G1P0. I reviewed her allergies, medications and Medical/Surgical/OB history.    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  Patient also informed that the ultrasound is not being completed with the intent of assessing for fetal or placental anomalies or any pelvic abnormalities. Explained that the purpose of today's ultrasound is to assess for viability.  Patient acknowledges the purpose of the exam and the limitations of the study.     Delivery Plans Plans to deliver at [facility]. Discussed the nature of our practice with multiple providers including residents and students. Due to the size of the practice, the delivering provider may not be the same as those providing prenatal care.   MyChart/Babyscripts MyChart access verified. I explained pt will have some visits in office and some virtually. Babyscripts app discussed and ordered.   Blood Pressure Cuff Blood pressure cuff discussedDiscussed to be used for virtual visits and or if needed BP checks weekly.  Anatomy US  Explained first scheduled US  will be around 19 weeks.   Last Pap Diagnosis  Date Value Ref Range Status  06/12/2023 - Low grade squamous intraepithelial lesion (LSIL) (A)  Final    First visit review I reviewed new OB appt with patient. Explained pt will be seen by Dr. Abigail at first visit. Discussed [facility] genetic screening with patient and significant other. Routine prenatal labs ordered.    Erminio DELENA Rumps, [place] 12/11/2023  2:48 PM

## 2023-12-12 ENCOUNTER — Ambulatory Visit: Payer: Self-pay | Admitting: Obstetrics and Gynecology

## 2023-12-12 DIAGNOSIS — R8271 Bacteriuria: Secondary | ICD-10-CM

## 2023-12-12 LAB — CBC/D/PLT+RPR+RH+ABO+RUBIGG...
Antibody Screen: NEGATIVE
Basophils Absolute: 0 x10E3/uL (ref 0.0–0.2)
Basos: 0 %
EOS (ABSOLUTE): 0.1 x10E3/uL (ref 0.0–0.4)
Eos: 1 %
HCV Ab: NONREACTIVE
HIV Screen 4th Generation wRfx: NONREACTIVE
Hematocrit: 45.9 % (ref 34.0–46.6)
Hemoglobin: 15.1 g/dL (ref 11.1–15.9)
Hepatitis B Surface Ag: NEGATIVE
Immature Grans (Abs): 0 x10E3/uL (ref 0.0–0.1)
Immature Granulocytes: 0 %
Lymphocytes Absolute: 1.5 x10E3/uL (ref 0.7–3.1)
Lymphs: 18 %
MCH: 29 pg (ref 26.6–33.0)
MCHC: 32.9 g/dL (ref 31.5–35.7)
MCV: 88 fL (ref 79–97)
Monocytes Absolute: 0.6 x10E3/uL (ref 0.1–0.9)
Monocytes: 7 %
Neutrophils Absolute: 5.9 x10E3/uL (ref 1.4–7.0)
Neutrophils: 73 %
Platelets: 370 x10E3/uL (ref 150–450)
RBC: 5.2 x10E6/uL (ref 3.77–5.28)
RDW: 13.1 % (ref 11.7–15.4)
RPR Ser Ql: NONREACTIVE
Rh Factor: POSITIVE
Rubella Antibodies, IGG: 1.57 {index} (ref >0.99)
WBC: 8.1 x10E3/uL (ref 3.4–10.8)

## 2023-12-12 LAB — HCV INTERPRETATION

## 2023-12-15 ENCOUNTER — Encounter: Payer: Self-pay | Admitting: Obstetrics and Gynecology

## 2023-12-15 DIAGNOSIS — R8271 Bacteriuria: Secondary | ICD-10-CM | POA: Insufficient documentation

## 2023-12-15 LAB — URINE CULTURE, OB REFLEX

## 2023-12-15 LAB — CULTURE, OB URINE

## 2023-12-15 MED ORDER — AMOXICILLIN 500 MG PO CAPS: 500.0000 mg | ORAL_CAPSULE | Freq: Three times a day (TID) | ORAL | 0 refills | Status: AC

## 2023-12-21 ENCOUNTER — Inpatient Hospital Stay (HOSPITAL_COMMUNITY)
Admission: AD | Admit: 2023-12-21 | Discharge: 2023-12-21 | Disposition: A | Discharge status: Home / Self Care | Attending: Obstetrics & Gynecology | Admitting: Obstetrics & Gynecology

## 2023-12-21 DIAGNOSIS — Z3A10 10 weeks gestation of pregnancy: Secondary | ICD-10-CM | POA: Diagnosis not present

## 2023-12-21 DIAGNOSIS — O219 Vomiting of pregnancy, unspecified: Secondary | ICD-10-CM | POA: Insufficient documentation

## 2023-12-21 DIAGNOSIS — N390 Urinary tract infection, site not specified: Secondary | ICD-10-CM | POA: Diagnosis present

## 2023-12-21 DIAGNOSIS — R109 Unspecified abdominal pain: Secondary | ICD-10-CM

## 2023-12-21 DIAGNOSIS — R1030 Lower abdominal pain, unspecified: Secondary | ICD-10-CM | POA: Diagnosis present

## 2023-12-21 DIAGNOSIS — R112 Nausea with vomiting, unspecified: Secondary | ICD-10-CM

## 2023-12-21 DIAGNOSIS — O26891 Other specified pregnancy related conditions, first trimester: Secondary | ICD-10-CM | POA: Diagnosis not present

## 2023-12-21 LAB — COMPREHENSIVE METABOLIC PANEL WITH GFR
ALT: 16 U/L (ref 0–44)
AST: 20 U/L (ref 15–41)
Albumin: 3.6 g/dL (ref 3.5–5.0)
Alkaline Phosphatase: 61 U/L (ref 38–126)
Anion gap: 8 (ref 5–15)
BUN: <5 mg/dL — ABNORMAL LOW (ref 6–20)
CO2: 24 mmol/L (ref 22–32)
Calcium: 9.6 mg/dL (ref 8.9–10.3)
Chloride: 103 mmol/L (ref 98–111)
Creatinine, Ser: 0.72 mg/dL (ref 0.44–1.00)
GFR, Estimated: >60 mL/min (ref >60)
Glucose, Bld: 92 mg/dL (ref 70–99)
Potassium: 4.1 mmol/L (ref 3.5–5.1)
Sodium: 135 mmol/L (ref 135–145)
Total Bilirubin: 0.6 mg/dL (ref 0.0–1.2)
Total Protein: 7.5 g/dL (ref 6.5–8.1)

## 2023-12-21 LAB — CBC WITH DIFFERENTIAL/PLATELET
Abs Immature Granulocytes: 0.03 K/uL (ref 0.00–0.07)
Basophils Absolute: 0 K/uL (ref 0.0–0.1)
Basophils Relative: 0 %
Eosinophils Absolute: 0.1 K/uL (ref 0.0–0.5)
Eosinophils Relative: 1 %
HCT: 42.1 % (ref 36.0–46.0)
Hemoglobin: 13.6 g/dL (ref 12.0–15.0)
Immature Granulocytes: 0 %
Lymphocytes Relative: 18 %
Lymphs Abs: 1.5 K/uL (ref 0.7–4.0)
MCH: 27.5 pg (ref 26.0–34.0)
MCHC: 32.3 g/dL (ref 30.0–36.0)
MCV: 85.2 fL (ref 80.0–100.0)
Monocytes Absolute: 0.5 K/uL (ref 0.1–1.0)
Monocytes Relative: 6 %
Neutro Abs: 6.1 K/uL (ref 1.7–7.7)
Neutrophils Relative %: 75 %
Platelets: 330 K/uL (ref 150–400)
RBC: 4.94 MIL/uL (ref 3.87–5.11)
RDW: 12.8 % (ref 11.5–15.5)
WBC: 8.2 K/uL (ref 4.0–10.5)
nRBC: 0 % (ref 0.0–0.2)

## 2023-12-21 LAB — URINALYSIS, ROUTINE W REFLEX MICROSCOPIC
Bilirubin Urine: NEGATIVE
Glucose, UA: NEGATIVE mg/dL
Hgb urine dipstick: NEGATIVE
Ketones, ur: NEGATIVE mg/dL
Nitrite: NEGATIVE
Protein, ur: NEGATIVE mg/dL
Specific Gravity, Urine: 1.006 (ref 1.005–1.030)
pH: 7 (ref 5.0–8.0)

## 2023-12-21 LAB — MAGNESIUM: Magnesium: 1.9 mg/dL (ref 1.7–2.4)

## 2023-12-21 MED ORDER — SCOPOLAMINE 1 MG/3DAYS TD PT72: 1.0000 | MEDICATED_PATCH | TRANSDERMAL | 12 refills | Status: DC

## 2023-12-21 MED ORDER — SCOPOLAMINE 1 MG/3DAYS TD PT72: 1.0000 | MEDICATED_PATCH | TRANSDERMAL | 0 refills | Status: DC

## 2023-12-21 MED ORDER — LACTATED RINGERS IV BOLUS
1000.0000 mL | Freq: Once | INTRAVENOUS | Status: AC
  Administered 2023-12-21: 1000 mL via INTRAVENOUS

## 2023-12-21 MED ORDER — FAMOTIDINE IN NACL 20-0.9 MG/50ML-% IV SOLN
20.0000 mg | Freq: Once | INTRAVENOUS | Status: AC
  Administered 2023-12-21: 20 mg via INTRAVENOUS
  Filled 2023-12-21: qty 50

## 2023-12-21 MED ORDER — DOCUSATE SODIUM 100 MG PO CAPS: 100.0000 mg | ORAL_CAPSULE | Freq: Every day | ORAL | 0 refills | Status: AC

## 2023-12-21 MED ORDER — FAMOTIDINE 40 MG PO TABS: 40.0000 mg | ORAL_TABLET | Freq: Every evening | ORAL | 0 refills | Status: DC

## 2023-12-21 MED ORDER — POLYETHYLENE GLYCOL 3350 17 G PO PACK: 17.0000 g | PACK | Freq: Every day | ORAL | 0 refills | Status: DC

## 2023-12-21 MED ORDER — METOCLOPRAMIDE HCL 5 MG/ML IJ SOLN
5.0000 mg | Freq: Once | INTRAMUSCULAR | Status: AC
  Administered 2023-12-21: 5 mg via INTRAVENOUS
  Filled 2023-12-21: qty 2

## 2023-12-21 MED ORDER — SCOPOLAMINE 1 MG/3DAYS TD PT72
1.0000 | MEDICATED_PATCH | TRANSDERMAL | Status: DC
  Administered 2023-12-21: 1 mg via TRANSDERMAL
  Filled 2023-12-21: qty 1

## 2023-12-21 NOTE — MAU Provider Note (Addendum)
 Chief Complaint: Abdominal Pain, Emesis, and Nausea   Event Date/Time   First Provider Initiated Contact with Patient 12/21/23 1259     SUBJECTIVE HPI: Ariel Golden is a 23 y.o. G1P0 at [redacted]w[redacted]d who presents to Maternity Admissions c/o nauseated and vomiting. Patient states can not kept anything down in the last few days. She has been taking Zofran  and Phenergan  as needed, however both medication does not help relieved her symptoms. Per pt she recently diagnosed with UTI. States poor oral intake and she did not able to completed the course of her antibiotic for UTIs. Pt denies fever or worsening abdominal pain    Past Medical History:  Diagnosis Date   Medical history non-contributory    OB History  Gravida Para Term Preterm AB Living  1     0  SAB IAB Ectopic Multiple Live Births          # Outcome Date GA Lbr Len/2nd Weight Sex Type Anes PTL Lv  1 Current            Past Surgical History:  Procedure Laterality Date   WISDOM TOOTH EXTRACTION  2020   Social History   Socioeconomic History   Marital status: Single    Spouse name: Not on file   Number of children: Not on file   Years of education: Not on file   Highest education level: Not on file  Occupational History   Occupation: Adult nurse  Tobacco Use   Smoking status: Never   Smokeless tobacco: Never  Vaping Use   Vaping status: Never Used  Substance and Sexual Activity   Alcohol use: Not Currently   Drug use: Never   Sexual activity: Yes  Other Topics Concern   Not on file  Social History Narrative   Not on file   Social Drivers of Health   Financial Resource Strain: Not on file  Food Insecurity: No Food Insecurity (01/10/2022)   Received from [facility]   Hunger Vital Sign    Worried About Running Out of Food in the Last Year: Never true    Ran Out of Food in the Last Year: Never true  Transportation Needs: No Transportation Needs (01/10/2022)   Received from [facility]   PRAPARE -  Transportation    Lack of Transportation (Medical): No    Lack of Transportation (Non-Medical): No  Physical Activity: Not on file  Stress: Not on file  Social Connections: Not on file  Intimate Partner Violence: Not on file   Family History  Problem Relation Age of Onset   Diabetes Mother    Diabetes Maternal Grandmother    Kidney disease Maternal Grandmother    Stroke Maternal Grandfather    Heart disease Maternal Grandfather    Varicose Veins Maternal Grandfather    Hyperlipidemia Maternal Grandfather    No current facility-administered medications on file prior to encounter.   Current Outpatient Medications on File Prior to Encounter  Medication Sig Dispense Refill   ondansetron  (ZOFRAN ) 4 MG tablet Take 2 tablets (8 mg total) by mouth 2 (two) times daily. 20 tablet 0   Prenatal Vit-Fe Fumarate-FA (PRENATAL COMPLETE) 14-0.4 MG TABS Take 1 tablet by mouth daily. 60 tablet 3   promethazine  (PHENERGAN ) 25 MG tablet Take 1 tablet (25 mg total) by mouth every 6 (six) hours as needed for nausea or vomiting. 30 tablet 2   scopolamine  (TRANSDERM-SCOP) 1 MG/3DAYS Place 1 patch (1.5 mg total) onto the skin every 3 (three) days. 10 patch  12   ferrous sulfate 325 (65 FE) MG tablet Take 325 mg by mouth daily with breakfast. (Patient not taking: Reported on 12/11/2023)     No Known Allergies  I have reviewed patient's Past Medical Hx, Surgical Hx, Family Hx, Social Hx, medications and allergies.   Review of Systems  Respiratory: Negative.    Cardiovascular: Negative.   Gastrointestinal:  Positive for abdominal pain and nausea.    OBJECTIVE Patient Vitals for the past 24 hrs:  BP Temp Temp src Pulse Resp SpO2 Height Weight  12/21/23 1220 121/67 98.6 F (37 C) Axillary 83 16 100 % 5' 5 (1.651 m) 82.1 kg   Constitutional: Well-developed, well-nourished female in no acute distress.  Cardiovascular: normal rate & rhythm, no murmur Respiratory: normal rate and effort. Lung sounds clear  throughout GI: Abd soft, non-tender, Pos BS x 4. No guarding or rebound tenderness MS: Extremities nontender, no edema, normal ROM Neurologic: Alert and oriented x 4.  FHR 167   LAB RESULTS Results for orders placed or performed during the hospital encounter of 12/21/23 (from the past 24 hours)  Urinalysis, Routine w reflex microscopic -Urine, Clean Catch     Status: Abnormal   Collection Time: 12/21/23 12:25 PM  Result Value Ref Range   Color, Urine YELLOW YELLOW   APPearance HAZY (A) CLEAR   Specific Gravity, Urine 1.006 1.005 - 1.030   pH 7.0 5.0 - 8.0   Glucose, UA NEGATIVE NEGATIVE mg/dL   Hgb urine dipstick NEGATIVE NEGATIVE   Bilirubin Urine NEGATIVE NEGATIVE   Ketones, ur NEGATIVE NEGATIVE mg/dL   Protein, ur NEGATIVE NEGATIVE mg/dL   Nitrite NEGATIVE NEGATIVE   Leukocytes,Ua LARGE (A) NEGATIVE   RBC / HPF 6-10 0 - 5 RBC/hpf   WBC, UA 0-5 0 - 5 WBC/hpf   Bacteria, UA FEW (A) NONE SEEN   Squamous Epithelial / HPF 6-10 0 - 5 /HPF   Mucus PRESENT   Comprehensive metabolic panel     Status: Abnormal   Collection Time: 12/21/23  1:39 PM  Result Value Ref Range   Sodium 135 135 - 145 mmol/L   Potassium 4.1 3.5 - 5.1 mmol/L   Chloride 103 98 - 111 mmol/L   CO2 24 22 - 32 mmol/L   Glucose, Bld 92 70 - 99 mg/dL   BUN <5 (L) 6 - 20 mg/dL   Creatinine, Ser 9.27 0.44 - 1.00 mg/dL   Calcium 9.6 8.9 - 89.6 mg/dL   Total Protein 7.5 6.5 - 8.1 g/dL   Albumin 3.6 3.5 - 5.0 g/dL   AST 20 15 - 41 U/L   ALT 16 0 - 44 U/L   Alkaline Phosphatase 61 38 - 126 U/L   Total Bilirubin 0.6 0.0 - 1.2 mg/dL   GFR, Estimated >39 >39 mL/min   Anion gap 8 5 - 15  Magnesium     Status: None   Collection Time: 12/21/23  1:39 PM  Result Value Ref Range   Magnesium 1.9 1.7 - 2.4 mg/dL  CBC with Differential/Platelet     Status: None   Collection Time: 12/21/23  1:47 PM  Result Value Ref Range   WBC 8.2 4.0 - 10.5 K/uL   RBC 4.94 3.87 - 5.11 MIL/uL   Hemoglobin 13.6 12.0 - 15.0 g/dL   HCT  57.8 63.9 - 53.9 %   MCV 85.2 80.0 - 100.0 fL   MCH 27.5 26.0 - 34.0 pg   MCHC 32.3 30.0 - 36.0 g/dL   RDW  12.8 11.5 - 15.5 %   Platelets 330 150 - 400 K/uL   nRBC 0.0 0.0 - 0.2 %   Neutrophils Relative % 75 %   Neutro Abs 6.1 1.7 - 7.7 K/uL   Lymphocytes Relative 18 %   Lymphs Abs 1.5 0.7 - 4.0 K/uL   Monocytes Relative 6 %   Monocytes Absolute 0.5 0.1 - 1.0 K/uL   Eosinophils Relative 1 %   Eosinophils Absolute 0.1 0.0 - 0.5 K/uL   Basophils Relative 0 %   Basophils Absolute 0.0 0.0 - 0.1 K/uL   Immature Granulocytes 0 %   Abs Immature Granulocytes 0.03 0.00 - 0.07 K/uL    IMAGING No results found.  MAU COURSE Orders Placed This Encounter  Procedures   Urinalysis, Routine w reflex microscopic -Urine, Clean Catch   Comprehensive metabolic panel   Magnesium   CBC with Differential/Platelet   Meds ordered this encounter  Medications   lactated ringers  bolus 1,000 mL   famotidine  (PEPCID ) IVPB 20 mg premix   metoCLOPramide  (REGLAN ) injection 5 mg   scopolamine  (TRANSDERM-SCOP) 1 MG/3DAYS 1 mg    MDM - Labs : CBC, CMP, Mg, UA - LR bolus 1L - Pepcid  20 mg IV - Reglan  5 mg IV - Scopolamine  patch   ASSESSMENT/PLAN 1. Nausea and vomiting, unspecified vomiting type   2. Abdominal pain, unspecified abdominal location   3. [redacted] weeks gestation of pregnancy    1. Nausea and vomiting, unspecified vomiting type (Primary) Nauseated improved w/ IV Reglan  and Scopolamine  patch.  - Labs : CBC, CMP, Mg, UA w/ result WNL - Refill Scopolamine  patch 2. Abdominal pain, unspecified abdominal location Improved during visit - Starting PO Pepcid   - f/o w/ OB as scheduled  3. [redacted] weeks gestation of pregnancy   Allergies as of 12/21/2023   No Known Allergies      Suzen Houston NOVAK, DO PGY 1 Family Medicine Resident 12/21/2023 4:32 PM

## 2023-12-21 NOTE — MAU Note (Signed)
 Ariel Golden is a 23 y.o. at [redacted]w[redacted]d here in MAU reporting: really bad nausea and can't keep anything down thinks she has a UTI, has abx for but can't keep the pill down. Lower abdominal cramping.  Ongoing N/V for 1 month, Has tried antiemetics with no relief. UTI was diagnosed earlier this week- cramping, lower abdominal pain, nausea, dizziness, burning with urination and increase in frequency. Denies any VB.   Onset of complaint: ongoing Pain score: 7 Vitals:   12/21/23 1220  BP: 121/67  Pulse: 83  Resp: 16  Temp: 98.6 F (37 C)  SpO2: 100%     FHT:167 Lab orders placed from triage:  UA

## 2024-01-07 ENCOUNTER — Ambulatory Visit (INDEPENDENT_AMBULATORY_CARE_PROVIDER_SITE_OTHER): Admitting: Obstetrics and Gynecology

## 2024-01-07 VITALS — BP 118/61 | HR 75 | Wt 176.0 lb

## 2024-01-07 DIAGNOSIS — O99891 Other specified diseases and conditions complicating pregnancy: Secondary | ICD-10-CM

## 2024-01-07 DIAGNOSIS — Z1332 Encounter for screening for maternal depression: Secondary | ICD-10-CM | POA: Diagnosis not present

## 2024-01-07 DIAGNOSIS — Z1331 Encounter for screening for depression: Secondary | ICD-10-CM | POA: Insufficient documentation

## 2024-01-07 DIAGNOSIS — Z3A13 13 weeks gestation of pregnancy: Secondary | ICD-10-CM

## 2024-01-07 DIAGNOSIS — R8271 Bacteriuria: Secondary | ICD-10-CM | POA: Diagnosis not present

## 2024-01-07 DIAGNOSIS — O99210 Obesity complicating pregnancy, unspecified trimester: Secondary | ICD-10-CM | POA: Insufficient documentation

## 2024-01-07 DIAGNOSIS — Z3401 Encounter for supervision of normal first pregnancy, first trimester: Secondary | ICD-10-CM

## 2024-01-07 MED ORDER — ASPIRIN 81 MG PO TBEC: 81.0000 mg | DELAYED_RELEASE_TABLET | Freq: Every day | ORAL | 2 refills | Status: AC

## 2024-01-07 NOTE — Progress Notes (Addendum)
 Chief Complaint  Patient presents with   Initial Prenatal Visit    Subjective:   Ariel Golden is a 23 y.o. G1P0 at [redacted]w[redacted]d by LMP c/w 7 wk ultrasound being seen today for her first obstetrical visit.  Her obstetrical history is significant for obesity (Pre gravid BMI 30). Patient does intend to breast feed. Pregnancy history fully reviewed.  Patient reports some nausea but reports it is improving and is manageable.  Flowsheet Row Initial Prenatal from 01/07/2024 in [facility]  PHQ-9 Total Score 13    HISTORY: OB History  Gravida Para Term Preterm AB Living  1 0 0 0 0 0  SAB IAB Ectopic Multiple Live Births  0 0 0 0 0    # Outcome Date GA Lbr Len/2nd Weight Sex Type Anes PTL Lv  1 Current              Last pap smear: Lab Results  Component Value Date   DIAGPAP - Low grade squamous intraepithelial lesion (LSIL) (A) 06/12/2023   Past Medical History:  Diagnosis Date   Medical history non-contributory    Past Surgical History:  Procedure Laterality Date   WISDOM TOOTH EXTRACTION  2020   Family History  Problem Relation Age of Onset   Diabetes Mother    Diabetes Maternal Grandmother    Kidney disease Maternal Grandmother    Stroke Maternal Grandfather    Heart disease Maternal Grandfather    Varicose Veins Maternal Grandfather    Hyperlipidemia Maternal Grandfather    Social History   Tobacco Use   Smoking status: Never   Smokeless tobacco: Never  Vaping Use   Vaping status: Never Used  Substance Use Topics   Alcohol use: Not Currently   Drug use: Never   No Known Allergies Current Outpatient Medications on File Prior to Visit  Medication Sig Dispense Refill   Prenatal Vit-Fe Fumarate-FA (PRENATAL COMPLETE) 14-0.4 MG TABS Take 1 tablet by mouth daily. 60 tablet 3   docusate sodium  (COLACE) 100 MG capsule Take 1 capsule (100 mg total) by mouth daily. (Patient not taking: Reported on 01/07/2024) 30 capsule 0    famotidine  (PEPCID ) 40 MG tablet Take 1 tablet (40 mg total) by mouth every evening. (Patient not taking: Reported on 01/07/2024) 30 tablet 0   ferrous sulfate 325 (65 FE) MG tablet Take 325 mg by mouth daily with breakfast. (Patient not taking: Reported on 01/07/2024)     ondansetron  (ZOFRAN ) 4 MG tablet Take 2 tablets (8 mg total) by mouth 2 (two) times daily. (Patient not taking: Reported on 01/07/2024) 20 tablet 0   polyethylene glycol (MIRALAX ) 17 g packet Take 17 g by mouth daily. (Patient not taking: Reported on 01/07/2024) 30 packet 0   promethazine  (PHENERGAN ) 25 MG tablet Take 1 tablet (25 mg total) by mouth every 6 (six) hours as needed for nausea or vomiting. (Patient not taking: Reported on 01/07/2024) 30 tablet 2   scopolamine  (TRANSDERM-SCOP) 1 MG/3DAYS Place 1 patch (1 mg total) onto the skin every 3 (three) days. (Patient not taking: Reported on 01/07/2024) 10 patch 0   scopolamine  (TRANSDERM-SCOP) 1 MG/3DAYS Place 1 patch (1 mg total) onto the skin every 3 (three) days. (Patient not taking: Reported on 01/07/2024) 10 patch 12   No current facility-administered medications on file prior to visit.     Exam   Vitals:   01/07/24 0929  BP: 118/61  Pulse: 75  Weight: 176 lb (79.8 kg)  Fetal Heart Rate (bpm): 156  General:  Alert, oriented and cooperative. Patient is in no acute distress.  Breast: deferred  Cardiovascular: Normal heart rate noted  Respiratory: Normal respiratory effort, no problems with respiration noted  Abdomen: Soft, gravid, appropriate for gestational age.  Pain/Pressure: Present     Pelvic: Cervical exam deferred        Extremities: Normal range of motion.  Edema: None  Mental Status: Normal mood and affect. Normal behavior. Normal judgment and thought content.    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 Encounter for supervision of normal first pregnancy in first trimester (Primary) Anticipatory guidance - PANORAMA PRENATAL TEST - HORIZON Basic Panel - Hemoglobin A1c - aspirin  EC 81 MG tablet; Take 1 tablet (81 mg total) by mouth at bedtime. Start taking when you are [redacted] weeks pregnant for rest of pregnancy for prevention of preeclampsia  Dispense: 300 tablet; Refill: 2 - US  MFM OB COMP + 14 WK; Future  2. Obesity during pregnancy Recommend aspirin  - aspirin  EC 81 MG tablet; Take 1 tablet (81 mg total) by mouth at bedtime. Start taking when you are [redacted] weeks pregnant for rest of pregnancy for prevention of preeclampsia  Dispense: 300 tablet; Refill: 2  3. Asymptomatic bacteriuria during pregnancy Test of cure - Culture, OB Urine  4. [redacted] weeks gestation of pregnancy   5. Positive screening for depression on 9-item Patient Health Questionnaire (PHQ-9) Declines intervention   Initial labs reviewed Continue prenatal vitamins. Genetic Screening discussed: NIPS, carrier screening and AFP, requested. Ultrasound discussed; fetal anatomic survey: requested. Problem list reviewed and updated. The nature of [facility] with multiple MDs and other Advanced Practice Providers was explained to patient; also emphasized that residents, students are part of our team. Routine obstetric precautions reviewed. Return in about 4 weeks (around 02/04/2024).  Rollo ONEIDA Bring, MD, FACOG Obstetrician & Gynecologist, [facility], [facility]

## 2024-01-08 ENCOUNTER — Ambulatory Visit: Payer: Self-pay | Admitting: Obstetrics and Gynecology

## 2024-01-08 DIAGNOSIS — D563 Thalassemia minor: Secondary | ICD-10-CM

## 2024-01-08 LAB — HEMOGLOBIN A1C
Est. average glucose Bld gHb Est-mCnc: 111 mg/dL
Hgb A1c MFr Bld: 5.5 % (ref 4.8–5.6)

## 2024-01-09 LAB — CULTURE, OB URINE

## 2024-01-09 LAB — URINE CULTURE, OB REFLEX

## 2024-01-14 LAB — PANORAMA PRENATAL TEST FULL PANEL:PANORAMA TEST PLUS 5 ADDITIONAL MICRODELETIONS: FETAL FRACTION: 6

## 2024-01-17 LAB — HORIZON CUSTOM: REPORT SUMMARY: POSITIVE — AB

## 2024-01-18 DIAGNOSIS — D563 Thalassemia minor: Secondary | ICD-10-CM | POA: Insufficient documentation

## 2024-01-18 NOTE — Telephone Encounter (Signed)
 Patient aware of [facility] results.  FOB will have testing at next appt.  Erminio DELENA Rumps [employer]

## 2024-01-18 NOTE — Telephone Encounter (Signed)
-----   Message from Rollo ONEIDA Bring sent at 01/18/2024  7:59 AM EDT ----- +alpha thal silent carrier. Please review with patient, recommend FOB testing, I have referred to MFM genetics. Thanks. ----- Message ----- From: Rebecka Memos Lab Results In Sent: 01/08/2024   5:37 AM EDT To: Rollo ONEIDA Bring, MD

## 2024-02-05 ENCOUNTER — Ambulatory Visit (INDEPENDENT_AMBULATORY_CARE_PROVIDER_SITE_OTHER)

## 2024-02-05 VITALS — BP 115/64 | HR 91 | Wt 178.0 lb

## 2024-02-05 DIAGNOSIS — Z3A17 17 weeks gestation of pregnancy: Secondary | ICD-10-CM

## 2024-02-05 DIAGNOSIS — Z3400 Encounter for supervision of normal first pregnancy, unspecified trimester: Secondary | ICD-10-CM

## 2024-02-05 MED ORDER — PRENATAL ADULT GUMMY/DHA/FA 0.4-25 MG PO CHEW: 1.0000 [IU] | CHEWABLE_TABLET | Freq: Every day | ORAL | 2 refills | Status: AC

## 2024-02-05 NOTE — Progress Notes (Signed)
   LOW-RISK PREGNANCY OFFICE VISIT  Patient name: Ariel Golden MRN [id number]  Date of birth: 05-Nov-2000 Chief Complaint:   Routine Prenatal Visit  Subjective:   [name] is a 23 y.o. 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  Patient presents today, with BF/FOB, with complaint of abdominal tenderness. She reports abdominal soreness and tenderness that is constant.  She states she does not wear maternity pants and has to wear slacks for work. Patient endorses fetal movement that started ~ 3 weeks ago! Patient reports abdominal cramping that occurs 2-3x/day in the form of a sharp pain through the pelvis or back. She states the pain is a couple seconds.   Patient denies vaginal concerns including abnormal discharge, leaking of fluid, and bleeding. No issues with urination, constipation, or diarrhea.    Contractions: Not present. Vag. Bleeding: None.  Movement: Absent.  Reviewed past medical,surgical, social, obstetrical and family history as well as problem list, medications and allergies.  Objective   Vitals:   02/05/24 0943  BP: 115/64  Pulse: 91  Weight: 178 lb 0.6 oz (80.8 kg)  Body mass index is 29.63 kg/m.  Total Weight Gain:-6 lb 15.4 oz (-3.157 kg)         Physical Examination:   General appearance: Well appearing, and in no distress  Mental status: Alert, oriented to person, place, and time  Skin: Warm & dry  Cardiovascular: Normal heart rate noted  Respiratory: Normal respiratory effort, no distress  Abdomen: Not assessed  Pelvic: Cervical exam deferred            Extremities: Edema: None  Fetal Status: Fetal Heart Rate (bpm): 152  Movement: Absent   No results found for this or any previous visit (from the past 24 hours).   Assessment & Plan   Low-risk pregnancy of a 23 y.o., G1P0 at [redacted]w[redacted]d with an Estimated Date of Delivery: 07/12/24   1. Supervision of normal first pregnancy, antepartum -Anticipatory guidance for upcoming appts. -Patient to schedule next appt in 4 weeks for an in-person visit. -AFP today -FOB given information and paperwork for genetic testing.   2. [redacted] weeks gestation of pregnancy -Doing well overall.  -Discussed usage of maternity clothing or band to  decrease restriction of growing abdomen/hips.       Meds: No orders of the defined types were placed in this encounter.  Labs/procedures today:  Lab Orders         AFP, Serum, Open Spina Bifida      Reviewed: Preterm labor symptoms and general obstetric precautions including but not limited to vaginal bleeding, contractions, leaking of fluid and fetal movement were reviewed in detail with the patient.  All questions were answered.  Follow-up: No follow-ups on file.  Orders Placed This Encounter  Procedures   AFP, Serum, Open Spina Bifida   Harlene LITTIE Duncans MSN, CNM 02/05/2024

## 2024-02-07 LAB — AFP, SERUM, OPEN SPINA BIFIDA
AFP MoM: 1.74
AFP Value: 64.3 ng/mL
Gest. Age on Collection Date: 17 wk
Maternal Age At EDD: 23.7 a
OSBR Risk 1 IN: 2954
Test Results:: NEGATIVE
Weight: 178 [lb_av]

## 2024-02-14 ENCOUNTER — Ambulatory Visit: Payer: Self-pay

## 2024-02-18 ENCOUNTER — Other Ambulatory Visit: Payer: Self-pay | Admitting: *Deleted

## 2024-02-18 ENCOUNTER — Ambulatory Visit

## 2024-02-18 ENCOUNTER — Ambulatory Visit: Attending: Obstetrics and Gynecology | Admitting: Obstetrics

## 2024-02-18 ENCOUNTER — Other Ambulatory Visit: Payer: Self-pay | Admitting: Obstetrics and Gynecology

## 2024-02-18 DIAGNOSIS — O99013 Anemia complicating pregnancy, third trimester: Secondary | ICD-10-CM | POA: Diagnosis not present

## 2024-02-18 DIAGNOSIS — Z3A13 13 weeks gestation of pregnancy: Secondary | ICD-10-CM

## 2024-02-18 DIAGNOSIS — Z363 Encounter for antenatal screening for malformations: Secondary | ICD-10-CM | POA: Diagnosis present

## 2024-02-18 DIAGNOSIS — Z3401 Encounter for supervision of normal first pregnancy, first trimester: Secondary | ICD-10-CM

## 2024-02-18 DIAGNOSIS — E669 Obesity, unspecified: Secondary | ICD-10-CM

## 2024-02-18 DIAGNOSIS — Z3A19 19 weeks gestation of pregnancy: Secondary | ICD-10-CM | POA: Diagnosis not present

## 2024-02-18 DIAGNOSIS — D563 Thalassemia minor: Secondary | ICD-10-CM

## 2024-02-18 DIAGNOSIS — O99012 Anemia complicating pregnancy, second trimester: Secondary | ICD-10-CM

## 2024-02-18 DIAGNOSIS — O99210 Obesity complicating pregnancy, unspecified trimester: Secondary | ICD-10-CM

## 2024-02-18 DIAGNOSIS — Z148 Genetic carrier of other disease: Secondary | ICD-10-CM | POA: Diagnosis not present

## 2024-02-18 DIAGNOSIS — O358XX0 Maternal care for other (suspected) fetal abnormality and damage, not applicable or unspecified: Secondary | ICD-10-CM

## 2024-02-18 DIAGNOSIS — Z362 Encounter for other antenatal screening follow-up: Secondary | ICD-10-CM

## 2024-02-18 DIAGNOSIS — O99891 Other specified diseases and conditions complicating pregnancy: Secondary | ICD-10-CM

## 2024-02-18 DIAGNOSIS — O280 Abnormal hematological finding on antenatal screening of mother: Secondary | ICD-10-CM

## 2024-02-18 DIAGNOSIS — Z1331 Encounter for screening for depression: Secondary | ICD-10-CM

## 2024-02-18 DIAGNOSIS — O99212 Obesity complicating pregnancy, second trimester: Secondary | ICD-10-CM | POA: Diagnosis not present

## 2024-02-18 NOTE — Progress Notes (Unsigned)
 MFM Consult Note  Ariel Golden is currently at 19 weeks and 2 days.  Ariel Golden was seen due to maternal obesity and as her Horizon screening test indicated that Ariel Golden is a silent carrier for alpha thalassemia.  Ariel Golden denies any significant past medical history and denies any problems in her current pregnancy.    Her cell free DNA test earlier indicated a low risk for trisomy 21, 18, and 13. A female fetus is predicted.  Sonographic findings Single intrauterine pregnancy at 19w 2d  Fetal cardiac activity:  Observed and appears normal. Presentation: Breech. The anatomic structures that were well seen appear normal without evidence of soft markers. Due to poor acoustic windows some structures remain suboptimally visualized. Fetal biometry shows the estimated fetal weight at the 59th percentile.  Amniotic fluid: Within normal limits.  MVP: 3.09 cm. Placenta: Anterior. Adnexa: No abnormality visualized. Cervical length: 4.5 cm.  The views of the fetal anatomy were limited today due to the fetal position.  The patient was informed that anomalies may be missed due to technical limitations. If the fetus is in a suboptimal position or maternal habitus is increased, visualization of the fetus in the maternal uterus may be impaired.  Silent carrier for alpha thalassemia The patient was advised that alpha thalassemia is inherited in an autosomal recessive fashion.  Ariel Golden was advised to have her partner tested to determine if he is also a carrier for this condition.   Should he screen negative for alpha thalassemia, their child should be at low risk for inheriting this disease. Should both parents be carriers for this condition, their child may have a 25% chance of inheriting the disease.    A follow-up exam was scheduled in 5 weeks to complete the views of the fetal anatomy and to assess the fetal growth.    The patient stated that all of her questions were answered today.  A total of 30 minutes was spent  counseling and coordinating the care for this patient.  Greater than 50% of the time was spent in direct face-to-face contact.

## 2024-02-26 ENCOUNTER — Telehealth: Payer: Self-pay

## 2024-03-04 ENCOUNTER — Ambulatory Visit: Attending: Obstetrics and Gynecology

## 2024-03-04 DIAGNOSIS — D563 Thalassemia minor: Secondary | ICD-10-CM

## 2024-03-04 NOTE — Progress Notes (Signed)
 [facility] at [facility] [address] Phone:  [phone]   Fax:  [phone]      In-Person Genetic Counseling Clinic Note:   I spoke with 23 y.o. Ariel Golden today to discuss her carrier screening results. She was referred by Ariel Rollo DASEN, MD. She was accompanied by FOB Ariel Golden.   Pregnancy History:    G1P0. EGA: [redacted]w[redacted]d by US . EDD: 07/12/2024. Denies major personal health concerns. Denies bleeding, infections, and fevers in this pregnancy. Denies using tobacco, alcohol, or street drugs in this pregnancy.   Family History:    A three-generation pedigree was created and scanned into Epic under the Media tab.  Patient and FOB have limited paternal family histories.  Patient ethnicity reported as Black and FOB ethnicity reported as White. Denies Ashkenazi Jewish ancestry.  Family history not remarkable for consanguinity, individuals with birth defects, intellectual disability, autism spectrum disorder, multiple spontaneous abortions, still births, or unexplained neonatal death.   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 She screened negative for the other three conditions (CF, SMA, and beta-hemoglobinopathies) which significantly reduces but does not eliminate the chance of being a carrier for those conditions. Please see report for details.  We reviewed the genetics of alpha thalassemia, autosomal recessive mode of inheritance, and clinical features of these conditions. We reviewed that Ariel Golden will either pass down two copies of the alpha globin gene (??) OR one copy of the alpha globin gene (-?) in each pregnancy. Therefore, this pregnancy is not at increased risk for hemoglobin Bart's due to four deletions of the alpha globin genes (--/--) regardless of her reproductive partner's  carrier status. The pregnancy will be at increased risk (25%) for hemoglobin H disease if her partner is an alpha thalassemia carrier in the cis configuration (--/??). We reviewed that this is more common in Southeast Asian populations and less common in those with Black ancestry.  Given these results, we discussed and offered carrier screening for Ariel Golden's reproductive partner. We reviewed the benefits and limitations of carrier screening and that it can detect most but not all carriers.  FOB stated he received instructions for a blood draw from the [facility] office. He has not had his blood drawn yet. We offered him a blood draw or saliva collection today. He declined a blood draw and was unable to collect a saliva sample as he had a drink within the past 30 minutes. He stated that he was informed at the [facility] office that the testing and blood draw would be free, so he wishes to pursue that option. He declined taking home a saliva kit. I informed him of [facility] compassionate care program. He informed us  that he will reach out to our clinic if he wishes to proceed with testing here. If testing is performed outside of our office, results can be sent to us  for review. He consented we call Ariel Golden with his results.  We reviewed that if both were found to be carriers, prenatal diagnosis through amniocentesis would be available. We reviewed the technical aspects, benefits, risks, and limitations of amniocentesis including the 1 in 500 risk for miscarriage.  Alternatively, testing can be completed postnatally. Of note, alpha thalassemia is not included on [place] 's Newborn Screening (NBS) program.   Newborn Screening. The [facility]  Newborn Screening (NBS) program  will screen all newborn babies for cystic fibrosis, spinal muscular atrophy, hemoglobinopathies, and numerous other conditions.  Previous Testing Completed:  Low risk NIPS: Ariel Golden previously completed [facility] noninvasive prenatal  screening (NIPS) in this pregnancy. The result is low risk, consistent with a female fetus. This screening significantly reduces but does not eliminate the chance that the current pregnancy has Down syndrome (trisomy 21), trisomy 74, trisomy 82, and common sex chromosome conditions. Please see report for details. There are many genetic conditions that cannot be detected by NIPS.   Negative ms-AFP screening: Ariel Golden previously completed a maternal serum AFP screen in this pregnancy. The result is screen negative. Please see report for details. A negative result reduces the risk that the current pregnancy has an open neural tube defect. Closed neural tube defects and some open defects may not be detected by this screen.   Plan of Care:   FOB wishes to proceed with carrier screening that was offered at [facility] office and declined testing at our clinic. He reports having the instruction sheet and will reach out to our clinic if he wishes to schedule a lab visit with us  instead. If testing is completed at [facility] office, the results can be sent to us  for review. Declined amniocentesis. Follow-up [facility] ultrasound on 03/24/2024.   Informed consent was obtained. All questions were answered.   50 minutes were spent on the date of the encounter in service to the patient including preparation, face-to-face consultation, discussion of test reports and available next steps, pedigree construction, genetic risk assessment, documentation, and care coordination.    Thank you for sharing in the care of [facility] with us .  Please do not hesitate to contact us  at [phone] if you have any questions.   Lauraine Bodily, MS, [facility] Certified Genetic Counselor   Genetic counseling student involved in appointment: No.

## 2024-03-10 ENCOUNTER — Ambulatory Visit (INDEPENDENT_AMBULATORY_CARE_PROVIDER_SITE_OTHER): Admitting: Obstetrics and Gynecology

## 2024-03-10 VITALS — BP 128/73 | HR 88 | Wt 189.1 lb

## 2024-03-10 DIAGNOSIS — O99210 Obesity complicating pregnancy, unspecified trimester: Secondary | ICD-10-CM | POA: Diagnosis not present

## 2024-03-10 DIAGNOSIS — Z23 Encounter for immunization: Secondary | ICD-10-CM | POA: Diagnosis not present

## 2024-03-10 DIAGNOSIS — Z3A22 22 weeks gestation of pregnancy: Secondary | ICD-10-CM

## 2024-03-10 DIAGNOSIS — D563 Thalassemia minor: Secondary | ICD-10-CM

## 2024-03-10 DIAGNOSIS — Z3400 Encounter for supervision of normal first pregnancy, unspecified trimester: Secondary | ICD-10-CM

## 2024-03-10 NOTE — Progress Notes (Deleted)
 r9

## 2024-03-10 NOTE — Progress Notes (Signed)
   PRENATAL VISIT NOTE  Subjective:  Ariel Golden is a 23 y.o. G1P0 at [redacted]w[redacted]d being seen today for ongoing prenatal care.  She is currently monitored for the following issues for this low-risk pregnancy and has LGSIL on Pap smear of cervix; Supervision of normal first pregnancy, antepartum; Asymptomatic bacteriuria during pregnancy; Obesity during pregnancy; Positive screening for depression on 9-item Patient Health Questionnaire (PHQ-9); and Alpha thalassemia silent carrier on their problem list.  Patient reports no complaints.  Contractions: Not present. Vag. Bleeding: None.  Movement: Present. Denies leaking of fluid.   The following portions of the patient's history were reviewed and updated as appropriate: allergies, current medications, past family history, past medical history, past social history, past surgical history and problem list.   Objective:   Vitals:   03/10/24 1312  BP: 128/73  Pulse: 88  Weight: 189 lb 1.9 oz (85.8 kg)   Body mass index is 31.47 kg/m. Total weight gain: 4 lb 1.9 oz (1.869 kg)   Fetal Status: Fetal Heart Rate (bpm): 155 Fundal Height: 22 cm Movement: Present     General:  Alert, oriented and cooperative. Patient is in no acute distress.  Skin: Skin is warm and dry. No rash noted.   Cardiovascular: Normal heart rate noted  Respiratory: Normal respiratory effort, no problems with respiration noted  Abdomen: Soft, gravid, appropriate for gestational age.  Pain/Pressure: Present     Pelvic: Cervical exam deferred        Extremities: Normal range of motion.  Edema: None  Mental Status: Normal mood and affect. Normal behavior. Normal judgment and thought content.   Assessment and Plan:  Pregnancy: G1P0 at [redacted]w[redacted]d 1. Supervision of normal first pregnancy, antepartum Anticipatory guidance  2. Obesity during pregnancy Growth ultrasound scheduled  3. Alpha thalassemia silent carrier FOB [facility] with [facility]  4. [redacted] weeks gestation of  pregnancy (Primary)   5. Need for influenza vaccination - Flu vaccine trivalent PF, 6mos and older(Flulaval,Afluria,Fluarix,Fluzone)  Preterm labor symptoms and general obstetric precautions including but not limited to vaginal bleeding, contractions, leaking of fluid and fetal movement were reviewed in detail with the patient. Please refer to After Visit Summary for other counseling recommendations.   Return in about 4 weeks (around 04/07/2024).  Future Appointments  Date Time Provider Department Center  03/24/2024  9:15 AM [facility] PROVIDER 1 [facility]  03/24/2024  9:30 AM [facility] US3 [facility]  04/14/2024  8:15 AM Anyanwu, Gloris LABOR, MD [facility] None  04/28/2024  1:10 PM Abigail Rollo DASEN, MD [facility] None  05/12/2024  1:10 PM Abigail Rollo DASEN, MD [facility] None  05/26/2024  1:10 PM Brek Reece, Rollo DASEN, MD [facility] None    Rollo DASEN Abigail, MD

## 2024-03-12 ENCOUNTER — Ambulatory Visit: Payer: Self-pay

## 2024-03-24 ENCOUNTER — Ambulatory Visit (HOSPITAL_BASED_OUTPATIENT_CLINIC_OR_DEPARTMENT_OTHER)

## 2024-03-24 ENCOUNTER — Ambulatory Visit: Attending: Obstetrics | Admitting: Obstetrics

## 2024-03-24 VITALS — BP 113/56

## 2024-03-24 DIAGNOSIS — Z3A24 24 weeks gestation of pregnancy: Secondary | ICD-10-CM | POA: Insufficient documentation

## 2024-03-24 DIAGNOSIS — O280 Abnormal hematological finding on antenatal screening of mother: Secondary | ICD-10-CM

## 2024-03-24 DIAGNOSIS — O99212 Obesity complicating pregnancy, second trimester: Secondary | ICD-10-CM | POA: Diagnosis not present

## 2024-03-24 DIAGNOSIS — O285 Abnormal chromosomal and genetic finding on antenatal screening of mother: Secondary | ICD-10-CM

## 2024-03-24 DIAGNOSIS — Z362 Encounter for other antenatal screening follow-up: Secondary | ICD-10-CM

## 2024-03-24 NOTE — Progress Notes (Signed)
 MFM Consult Note  Ariel Golden is currently at [redacted]w[redacted]d. She has been followed due to as her Horizon screening test indicated that she is a silent carrier for alpha thalassemia.  The views of the fetal anatomy were unable to be fully visualized on her last exam.  The FOB is looking into testing to determine if he is also a carrier for alpha thalassemia with the [place] system.    She denies any problems since her last exam.  Her blood pressure was 113/56.  Sonographic findings Single intrauterine pregnancy at 24w 2d. Fetal cardiac activity:  Observed and appears normal. Presentation: Cephalic. The anatomic structures that were well seen appear normal. The anatomic survey is complete.  Fetal biometry shows the estimated fetal weight of 1 lb 6 oz,  614 grams (16%). Amniotic fluid: Within normal limits.  MVP: 5.27 cm. Placenta: Anterior.  The limitations of ultrasound in the detection of all anomalies was discussed today.  The FOB was encouraged to get screened to determine if he is also a carrier for alpha thalassemia.    As the views of the fetal anatomy were visualized today and the fetal growth is within normal limits, no further exams were scheduled in our office.  The patient stated that all of her questions were answered.   A total of 20 minutes was spent counseling and coordinating the care for this patient.  Greater than 50% of the time was spent in direct face-to-face contact.

## 2024-04-08 ENCOUNTER — Encounter (HOSPITAL_COMMUNITY): Payer: Self-pay | Admitting: Obstetrics and Gynecology

## 2024-04-08 ENCOUNTER — Inpatient Hospital Stay (HOSPITAL_COMMUNITY)
Admission: AD | Admit: 2024-04-08 | Discharge: 2024-04-08 | Disposition: A | Discharge status: Home / Self Care | Attending: Obstetrics and Gynecology | Admitting: Obstetrics and Gynecology

## 2024-04-08 DIAGNOSIS — O26892 Other specified pregnancy related conditions, second trimester: Secondary | ICD-10-CM | POA: Diagnosis not present

## 2024-04-08 DIAGNOSIS — R109 Unspecified abdominal pain: Secondary | ICD-10-CM | POA: Diagnosis not present

## 2024-04-08 DIAGNOSIS — Z3A26 26 weeks gestation of pregnancy: Secondary | ICD-10-CM | POA: Diagnosis not present

## 2024-04-08 DIAGNOSIS — O2342 Unspecified infection of urinary tract in pregnancy, second trimester: Secondary | ICD-10-CM | POA: Diagnosis not present

## 2024-04-08 LAB — URINALYSIS, ROUTINE W REFLEX MICROSCOPIC
Bilirubin Urine: NEGATIVE
Glucose, UA: NEGATIVE mg/dL
Hgb urine dipstick: NEGATIVE
Ketones, ur: NEGATIVE mg/dL
Nitrite: NEGATIVE
Protein, ur: NEGATIVE mg/dL
Specific Gravity, Urine: <1.005 — ABNORMAL LOW (ref 1.005–1.030)
pH: 7 (ref 5.0–8.0)

## 2024-04-08 LAB — URINALYSIS, MICROSCOPIC (REFLEX): RBC / HPF: NONE SEEN RBC/hpf (ref 0–5)

## 2024-04-08 MED ORDER — NITROFURANTOIN MONOHYD MACRO 100 MG PO CAPS: 100.0000 mg | ORAL_CAPSULE | Freq: Two times a day (BID) | ORAL | 0 refills | Status: AC

## 2024-04-08 MED ORDER — NITROFURANTOIN MONOHYD MACRO 100 MG PO CAPS
100.0000 mg | ORAL_CAPSULE | Freq: Once | ORAL | Status: AC
  Administered 2024-04-08: 100 mg via ORAL
  Filled 2024-04-08: qty 1

## 2024-04-08 NOTE — MAU Provider Note (Signed)
 History     [id number]  Arrival date and time: 04/08/24 2153    Chief Complaint  Patient presents with   Abdominal Pain     HPI Ariel Golden is a 23 y.o. at [redacted]w[redacted]d by 7 wk US , who presents for abdominal pain.   Patient reports abdominal pain, nausea, and concern for UTI Has had pain for about a week but has been worse today Pain is all across her lower pelvis No contractions, leaking fluid, bleeding Fetal movement is normal No new/unusual discharge No burning or pain with urination No constipation These symptoms are very similar to earlier in pregnancy when she was treated for Enterococcus faecalis UTI   O/Positive/-- (08/12 1459)  OB History     Gravida  1   Para      Term      Preterm      AB      Living  0      SAB      IAB      Ectopic      Multiple      Live Births              Past Medical History:  Diagnosis Date   Medical history non-contributory     Past Surgical History:  Procedure Laterality Date   WISDOM TOOTH EXTRACTION  2020    Family History  Problem Relation Age of Onset   Diabetes Mother    Diabetes Maternal Grandmother    Kidney disease Maternal Grandmother    Stroke Maternal Grandfather    Heart disease Maternal Grandfather    Varicose Veins Maternal Grandfather    Hyperlipidemia Maternal Grandfather     Social History   Socioeconomic History   Marital status: Single    Spouse name: Not on file   Number of children: Not on file   Years of education: Not on file   Highest education level: Not on file  Occupational History   Occupation: Adult Nurse  Tobacco Use   Smoking status: Never   Smokeless tobacco: Never  Vaping Use   Vaping status: Never Used  Substance and Sexual Activity   Alcohol use: Not Currently   Drug use: Never   Sexual activity: Yes  Other Topics Concern   Not on file  Social History Narrative   Not on file   Social Drivers of Health   Financial Resource Strain: Not  on file  Food Insecurity: No Food Insecurity (01/10/2022)   Received from [facility]   Hunger Vital Sign    Worried About Running Out of Food in the Last Year: Never true    Ran Out of Food in the Last Year: Never true  Transportation Needs: No Transportation Needs (01/10/2022)   Received from [facility]   PRAPARE - Transportation    Lack of Transportation (Medical): No    Lack of Transportation (Non-Medical): No  Physical Activity: Not on file  Stress: Not on file  Social Connections: Not on file  Intimate Partner Violence: Not on file    No Known Allergies  No current facility-administered medications on file prior to encounter.   Current Outpatient Medications on File Prior to Encounter  Medication Sig Dispense Refill   aspirin  EC 81 MG tablet Take 1 tablet (81 mg total) by mouth at bedtime. Start taking when you are [redacted] weeks pregnant for rest of pregnancy for prevention of preeclampsia 300 tablet 2   Prenatal MV & Min w/FA-DHA (PRENATAL ADULT GUMMY/DHA/FA) 0.4-25  MG CHEW Chew 1 Units by mouth daily. 90 tablet 2   famotidine  (PEPCID ) 40 MG tablet Take 1 tablet (40 mg total) by mouth every evening. (Patient not taking: Reported on 02/05/2024) 30 tablet 0   ondansetron  (ZOFRAN ) 4 MG tablet Take 2 tablets (8 mg total) by mouth 2 (two) times daily. (Patient not taking: Reported on 02/05/2024) 20 tablet 0   polyethylene glycol (MIRALAX ) 17 g packet Take 17 g by mouth daily. (Patient not taking: Reported on 02/05/2024) 30 packet 0   promethazine  (PHENERGAN ) 25 MG tablet Take 1 tablet (25 mg total) by mouth every 6 (six) hours as needed for nausea or vomiting. (Patient not taking: Reported on 02/05/2024) 30 tablet 2     ROS Pertinent positives and negative per HPI, all others reviewed and negative  Physical Exam   BP 120/71 (BP Location: Right Arm)   Pulse 97   Temp 98.5 F (36.9 C)   Resp 17   Ht 5' 4 (1.626 m)   Wt 87 kg   LMP 10/06/2023 Comment: May 2025  SpO2 100%   BMI  32.94 kg/m   Patient Vitals for the past 24 hrs:  BP Temp Pulse Resp SpO2 Height Weight  04/08/24 2224 120/71 -- 97 -- 100 % -- --  04/08/24 2203 112/63 -- -- -- -- -- --  04/08/24 2200 -- 98.5 F (36.9 C) 85 17 100 % 5' 4 (1.626 m) 87 kg    Physical Exam Vitals reviewed.  Constitutional:      General: She is not in acute distress.    Appearance: She is well-developed. She is not diaphoretic.  Eyes:     General: No scleral icterus. Pulmonary:     Effort: Pulmonary effort is normal. No respiratory distress.  Abdominal:     General: There is no distension.     Palpations: Abdomen is soft.     Tenderness: There is abdominal tenderness in the right lower quadrant, suprapubic area and left lower quadrant. There is no guarding or rebound.  Skin:    General: Skin is warm and dry.  Neurological:     Mental Status: She is alert.     Coordination: Coordination normal.      Cervical Exam Dilation: Closed Exam by:: Lola, MD  Bedside Ultrasound Not performed.  My interpretation: n/a  FHT Baseline: 135 bpm Variability: Good {> 6 bpm) Accelerations: Reactive Decelerations: Absent Uterine activity: None Cat: I  Labs No results found for this or any previous visit (from the past 24 hours).  Imaging No results found.  MAU Course  Procedures Lab Orders         Culture, OB Urine         Urinalysis, Routine w reflex microscopic -Urine, Clean Catch    Meds ordered this encounter  Medications   nitrofurantoin  (macrocrystal-monohydrate) (MACROBID ) capsule 100 mg   nitrofurantoin , macrocrystal-monohydrate, (MACROBID ) 100 MG capsule    Sig: Take 1 capsule (100 mg total) by mouth 2 (two) times daily for 5 days.    Dispense:  10 capsule    Refill:  0   Imaging Orders  No imaging studies ordered today    MDM Moderate (Level 3-4)  Assessment and Plan  #Abdominal pain in pregnancy, second trimester #UTI in pregnancy, second trimester #[redacted] weeks gestation of  pregnancy Unclear etiology, but given similarity to prior symptoms of UTI will treat empirically with macrobid . Cervix closed, low suspicion for preterm labor. May also have component of advancing gestational age.   #  FWB FHT Cat I NST: Reactive   Dispo: discharged to home in stable condition    Ariel CHRISTELLA Carolus, MD/MPH 04/08/24 11:08 PM  Allergies as of 04/08/2024   No Known Allergies      Medication List     TAKE these medications    aspirin  EC 81 MG tablet Take 1 tablet (81 mg total) by mouth at bedtime. Start taking when you are [redacted] weeks pregnant for rest of pregnancy for prevention of preeclampsia   famotidine  40 MG tablet Commonly known as: PEPCID  Take 1 tablet (40 mg total) by mouth every evening.   nitrofurantoin  (macrocrystal-monohydrate) 100 MG capsule Commonly known as: MACROBID  Take 1 capsule (100 mg total) by mouth 2 (two) times daily for 5 days.   ondansetron  4 MG tablet Commonly known as: Zofran  Take 2 tablets (8 mg total) by mouth 2 (two) times daily.   polyethylene glycol 17 g packet Commonly known as: MiraLax  Take 17 g by mouth daily.   Prenatal Adult Gummy/DHA/FA 0.4-25 MG Chew Chew 1 Units by mouth daily.   promethazine  25 MG tablet Commonly known as: PHENERGAN  Take 1 tablet (25 mg total) by mouth every 6 (six) hours as needed for nausea or vomiting.

## 2024-04-08 NOTE — MAU Note (Signed)
 Ariel Golden is a 23 y.o. at [redacted]w[redacted]d here in MAU reporting thinking she may have a uti. Had one earlier in pregnancy. Some lower abd pain that is worse with walking. Denies dysuria. Denies LOF or VB and reports good FM. Some yellow d/c. Pain present for a week.   LMP: na Onset of complaint: one week Pain score: 6 Vitals:   04/08/24 2200 04/08/24 2203  BP:  112/63  Pulse: 85   Resp: 17   Temp: 98.5 F (36.9 C)   SpO2: 100%      FHT: 155  Lab orders placed from triage: ua

## 2024-04-10 LAB — CULTURE, OB URINE: Culture: NO GROWTH

## 2024-04-14 ENCOUNTER — Ambulatory Visit: Admitting: Obstetrics & Gynecology

## 2024-04-14 VITALS — BP 111/66 | HR 84 | Wt 192.0 lb

## 2024-04-14 DIAGNOSIS — Z1331 Encounter for screening for depression: Secondary | ICD-10-CM

## 2024-04-14 DIAGNOSIS — Z3A27 27 weeks gestation of pregnancy: Secondary | ICD-10-CM

## 2024-04-14 DIAGNOSIS — Z3400 Encounter for supervision of normal first pregnancy, unspecified trimester: Secondary | ICD-10-CM

## 2024-04-14 DIAGNOSIS — Z23 Encounter for immunization: Secondary | ICD-10-CM

## 2024-04-14 NOTE — Progress Notes (Signed)
 PRENATAL VISIT NOTE  Subjective:  Ariel Golden is a 23 y.o. G1P0 at [redacted]w[redacted]d being seen today for ongoing prenatal care.  She is currently monitored for the following issues for this low-risk pregnancy and has LGSIL on Pap smear of cervix; Supervision of normal first pregnancy, antepartum; Asymptomatic bacteriuria during pregnancy; Obesity during pregnancy; Positive screening for depression on 9-item Patient Health Questionnaire (PHQ-9); and Alpha thalassemia silent carrier on their problem list.  Patient reports no complaints.  Contractions: Not present. Vag. Bleeding: None.  Movement: Present. Denies leaking of fluid.   The following portions of the patient's history were reviewed and updated as appropriate: allergies, current medications, past family history, past medical history, past social history, past surgical history and problem list.   Objective:   Vitals:   04/14/24 0844  BP: 111/66  Pulse: 84  Weight: 192 lb (87.1 kg)    Fetal Status:  Fetal Heart Rate (bpm): 145 Fundal Height: 27 cm Movement: Present    General: Alert, oriented and cooperative. Patient is in no acute distress.  Skin: Skin is warm and dry. No rash noted.   Cardiovascular: Normal heart rate noted  Respiratory: Normal respiratory effort, no problems with respiration noted  Abdomen: Soft, gravid, appropriate for gestational age.  Pain/Pressure: Present     Pelvic: Cervical exam deferred        Extremities: Normal range of motion.  Edema: None  Mental Status: Normal mood and affect. Normal behavior. Normal judgment and thought content.      04/14/2024    8:47 AM 01/07/2024    9:57 AM 06/12/2023    9:08 AM  Depression screen PHQ 2/9  Decreased Interest 2 2 0  Down, Depressed, Hopeless 2 1 1   PHQ - 2 Score 4 3 1   Altered sleeping 3 3 0  Tired, decreased energy 2 3 0  Change in appetite 1 2 1   Feeling bad or failure about yourself  0 1 0  Trouble concentrating 1 0 0  Moving slowly or  fidgety/restless 1 1 0  Suicidal thoughts 0 0 0  PHQ-9 Score 12 13  2       Data saved with a previous flowsheet row definition        04/14/2024    8:47 AM 01/07/2024    9:58 AM 06/12/2023    9:08 AM  GAD 7 : Generalized Anxiety Score  Nervous, Anxious, on Edge 3 2 1   Control/stop worrying 0 2 0  Worry too much - different things 2 2 1   Trouble relaxing 1 2 0  Restless 0 2 0  Easily annoyed or irritable 3 2 1   Afraid - awful might happen 1 2 0  Total GAD 7 Score 10 14 3     Assessment and Plan:  Pregnancy: G1P0 at [redacted]w[redacted]d 1. Positive screening for depression on 9-item Patient Health Questionnaire (PHQ-9) Patient verbally consented to [facility] services about presenting concerns. Declined trial of medication at this time.Denies SI/HI. - Amb ref to [facility]  2. Need for Tdap vaccination - Tdap vaccine greater than or equal to 7yo IM  3. [redacted] weeks gestation of pregnancy 4. Supervision of normal first pregnancy, antepartum (Primary) Labs done today, will follow up results and manage accordingly. Third trimester expectations reviewed and all questions answered. - Glucose Tolerance, 2 Hours w/1 Hour - CBC - HIV Antibody (routine testing w rflx) - RPR W/RFLX TO RPR TITER, TREPONEMAL AB, SCREEN AND DIAGNOSIS - Amb ref to [facility]  Preterm labor symptoms and general obstetric precautions including but not limited to vaginal bleeding, contractions, leaking of fluid and fetal movement were reviewed in detail with the patient. Please refer to After Visit Summary for other counseling recommendations.   Return in about 2 weeks (around 04/28/2024) for OB visits as scheduled.  Future Appointments  Date Time Provider Department Center  04/28/2024  1:10 PM Ariel Golden, Ariel DASEN, MD [facility] None  05/12/2024  1:10 PM Ariel Golden Ariel DASEN, MD [facility] None  05/26/2024  1:10 PM Ariel Golden Ariel DASEN, MD [facility] None  06/09/2024  1:10 PM Ariel Golden,  Ariel Golden LABOR, MD [facility] None  06/23/2024  1:10 PM Ariel Golden Ariel DASEN, MD [facility] None  06/30/2024  1:10 PM Dunn, Ariel DASEN, MD [facility] None    Ariel Golden Hugger, MD

## 2024-04-15 ENCOUNTER — Ambulatory Visit: Payer: Self-pay | Admitting: Obstetrics & Gynecology

## 2024-04-15 LAB — CBC
Hematocrit: 37.4 % (ref 34.0–46.6)
Hemoglobin: 11.8 g/dL (ref 11.1–15.9)
MCH: 27.4 pg (ref 26.6–33.0)
MCHC: 31.6 g/dL (ref 31.5–35.7)
MCV: 87 fL (ref 79–97)
Platelets: 317 x10E3/uL (ref 150–450)
RBC: 4.31 x10E6/uL (ref 3.77–5.28)
RDW: 11.9 % (ref 11.7–15.4)
WBC: 9.7 x10E3/uL (ref 3.4–10.8)

## 2024-04-15 LAB — HIV ANTIBODY (ROUTINE TESTING W REFLEX): HIV Screen 4th Generation wRfx: NONREACTIVE

## 2024-04-15 LAB — SYPHILIS: RPR W/REFLEX TO RPR TITER AND TREPONEMAL ANTIBODIES, TRADITIONAL SCREENING AND DIAGNOSIS ALGORITHM: RPR Ser Ql: NONREACTIVE

## 2024-04-22 ENCOUNTER — Other Ambulatory Visit

## 2024-04-22 DIAGNOSIS — Z3A28 28 weeks gestation of pregnancy: Secondary | ICD-10-CM

## 2024-04-22 NOTE — Progress Notes (Signed)
 Patient presents for 2 hr glucose test. Patient was sent to the lab to the lab located at [address] [facility] [address] [place], [place] [address]. Ariel Golden l Christoher Drudge, CMA

## 2024-04-23 LAB — GLUCOSE TOLERANCE, 2 HOURS W/ 1HR
Glucose, 1 hour: 132 mg/dL (ref 70–179)
Glucose, 2 hour: 112 mg/dL (ref 70–152)
Glucose, Fasting: 76 mg/dL (ref 70–91)

## 2024-04-25 ENCOUNTER — Ambulatory Visit: Payer: Self-pay | Admitting: Obstetrics and Gynecology

## 2024-04-25 DIAGNOSIS — Z3400 Encounter for supervision of normal first pregnancy, unspecified trimester: Secondary | ICD-10-CM

## 2024-04-28 ENCOUNTER — Ambulatory Visit (INDEPENDENT_AMBULATORY_CARE_PROVIDER_SITE_OTHER): Admitting: Obstetrics and Gynecology

## 2024-04-28 VITALS — BP 120/59 | HR 111 | Wt 195.0 lb

## 2024-04-28 DIAGNOSIS — Z3A29 29 weeks gestation of pregnancy: Secondary | ICD-10-CM

## 2024-04-28 DIAGNOSIS — Z3400 Encounter for supervision of normal first pregnancy, unspecified trimester: Secondary | ICD-10-CM

## 2024-04-28 DIAGNOSIS — O99210 Obesity complicating pregnancy, unspecified trimester: Secondary | ICD-10-CM

## 2024-04-28 NOTE — Progress Notes (Addendum)
 "  PRENATAL VISIT NOTE  Subjective:  Ariel Golden is a 23 y.o. G1P0 at [redacted]w[redacted]d being seen today for ongoing prenatal care.  She is currently monitored for the following issues for this low-risk pregnancy and has LGSIL on Pap smear of cervix; Supervision of normal first pregnancy, antepartum; Asymptomatic bacteriuria during pregnancy; Obesity during pregnancy; Positive screening for depression on 9-item Patient Health Questionnaire (PHQ-9); and Alpha thalassemia silent carrier on their problem list.  Patient reports no complaints.  Contractions: Not present. Vag. Bleeding: None.  Movement: Present. Denies leaking of fluid.   The following portions of the patient's history were reviewed and updated as appropriate: allergies, current medications, past family history, past medical history, past social history, past surgical history and problem list.   Objective:   Vitals:   04/28/24 1314  BP: (!) 120/59  Pulse: (!) 111  Weight: 195 lb (88.5 kg)    Fetal Status:  Fetal Heart Rate (bpm): 145 Fundal Height: 29 cm Movement: Present    General: Alert, oriented and cooperative. Patient is in no acute distress.  Skin: Skin is warm and dry. No rash noted.   Cardiovascular: Normal heart rate noted  Respiratory: Normal respiratory effort, no problems with respiration noted  Abdomen: Soft, gravid, appropriate for gestational age.  Pain/Pressure: Absent     Pelvic: Cervical exam deferred        Extremities: Normal range of motion.  Edema: None  Mental Status: Normal mood and affect. Normal behavior. Normal judgment and thought content.      04/14/2024    8:47 AM 01/07/2024    9:57 AM 06/12/2023    9:08 AM  Depression screen PHQ 2/9  Decreased Interest 2 2 0  Down, Depressed, Hopeless 2 1 1   PHQ - 2 Score 4 3 1   Altered sleeping 3 3 0  Tired, decreased energy 2 3 0  Change in appetite 1 2 1   Feeling bad or failure about yourself  0 1 0  Trouble concentrating 1 0 0  Moving slowly or  fidgety/restless 1 1 0  Suicidal thoughts 0 0 0  PHQ-9 Score 12 13  2       Data saved with a previous flowsheet row definition        04/14/2024    8:47 AM 01/07/2024    9:58 AM 06/12/2023    9:08 AM  GAD 7 : Generalized Anxiety Score  Nervous, Anxious, on Edge 3 2 1   Control/stop worrying 0 2 0  Worry too much - different things 2 2 1   Trouble relaxing 1 2 0  Restless 0 2 0  Easily annoyed or irritable 3 2 1   Afraid - awful might happen 1 2 0  Total GAD 7 Score 10 14 3     Assessment and Plan:  Pregnancy: G1P0 at [redacted]w[redacted]d 1. Supervision of normal first pregnancy, antepartum (Primary) Doing well, fetal kick counts reviewed Anticipatory guidance  2. Obesity during pregnancy Normal growth 11/24  Preterm labor symptoms and general obstetric precautions including but not limited to vaginal bleeding, contractions, leaking of fluid and fetal movement were reviewed in detail with the patient. Please refer to After Visit Summary for other counseling recommendations.   No follow-ups on file.  Future Appointments  Date Time Provider Department Center  05/12/2024  1:10 PM Abigail, Rollo DASEN, MD [facility] None  05/26/2024  1:10 PM Abigail, Rollo DASEN, MD [facility] None  06/09/2024  1:10 PM Anyanwu, Gloris LABOR, MD [facility] None  06/23/2024  1:10 PM  Abigail Rollo DASEN, MD [facility] None  06/30/2024  1:10 PM Abigail, Rollo DASEN, MD [facility] None    Rollo DASEN Abigail, MD "

## 2024-05-12 ENCOUNTER — Ambulatory Visit: Admitting: Obstetrics and Gynecology

## 2024-05-12 VITALS — BP 113/67 | HR 102 | Wt 195.0 lb

## 2024-05-12 DIAGNOSIS — Z3A31 31 weeks gestation of pregnancy: Secondary | ICD-10-CM | POA: Diagnosis not present

## 2024-05-12 DIAGNOSIS — Z3400 Encounter for supervision of normal first pregnancy, unspecified trimester: Secondary | ICD-10-CM

## 2024-05-12 NOTE — Progress Notes (Signed)
 "  PRENATAL VISIT NOTE  Subjective:  Ariel Golden is a 24 y.o. G1P0 at [redacted]w[redacted]d being seen today for ongoing prenatal care.  She is currently monitored for the following issues for this low-risk pregnancy and has LGSIL on Pap smear of cervix; Supervision of normal first pregnancy, antepartum; Asymptomatic bacteriuria during pregnancy; Obesity during pregnancy; Positive screening for depression on 9-item Patient Health Questionnaire (PHQ-9); and Alpha thalassemia silent carrier on their problem list.  Patient reports no complaints. Reports nose bleeds. Contractions: Irritability. Vag. Bleeding: None.  Movement: Present. Denies leaking of fluid.   The following portions of the patient's history were reviewed and updated as appropriate: allergies, current medications, past family history, past medical history, past social history, past surgical history and problem list.   Objective:   Vitals:   05/12/24 1341  BP: 113/67  Pulse: (!) 102  Weight: 195 lb 0.6 oz (88.5 kg)    Fetal Status:  Fetal Heart Rate (bpm): 153 Fundal Height: 30 cm Movement: Present    General: Alert, oriented and cooperative. Patient is in no acute distress.  Skin: Skin is warm and dry. No rash noted.   Cardiovascular: Normal heart rate noted  Respiratory: Normal respiratory effort, no problems with respiration noted  Abdomen: Soft, gravid, appropriate for gestational age.  Pain/Pressure: Present     Pelvic: Cervical exam deferred        Extremities: Normal range of motion.  Edema: None  Mental Status: Normal mood and affect. Normal behavior. Normal judgment and thought content.      04/14/2024    8:47 AM 01/07/2024    9:57 AM 06/12/2023    9:08 AM  Depression screen PHQ 2/9  Decreased Interest 2 2 0  Down, Depressed, Hopeless 2 1 1   PHQ - 2 Score 4 3 1   Altered sleeping 3 3 0  Tired, decreased energy 2 3 0  Change in appetite 1 2 1   Feeling bad or failure about yourself  0 1 0  Trouble concentrating 1 0 0   Moving slowly or fidgety/restless 1 1 0  Suicidal thoughts 0 0 0  PHQ-9 Score 12 13  2       Data saved with a previous flowsheet row definition        04/14/2024    8:47 AM 01/07/2024    9:58 AM 06/12/2023    9:08 AM  GAD 7 : Generalized Anxiety Score  Nervous, Anxious, on Edge 3 2 1   Control/stop worrying 0 2 0  Worry too much - different things 2 2 1   Trouble relaxing 1 2 0  Restless 0 2 0  Easily annoyed or irritable 3 2 1   Afraid - awful might happen 1 2 0  Total GAD 7 Score 10 14 3     Assessment and Plan:  Pregnancy: G1P0 at [redacted]w[redacted]d 1. Supervision of normal first pregnancy, antepartum (Primary) Anticipatory guidance Reports mood is ok, was referred to [facility] before but did not hear back, declines referral today, declines meds, support given S/p tdap / flu  2. [redacted] weeks gestation of pregnancy   Preterm labor symptoms and general obstetric precautions including but not limited to vaginal bleeding, contractions, leaking of fluid and fetal movement were reviewed in detail with the patient. Please refer to After Visit Summary for other counseling recommendations.   Return in about 2 weeks (around 05/26/2024).  Future Appointments  Date Time Provider Department Center  05/26/2024  1:10 PM Abigail, Rollo DASEN, MD [facility] None  06/09/2024  1:10 PM Anyanwu, Gloris LABOR, MD [facility] None  06/23/2024  1:10 PM Abigail Rollo DASEN, MD [facility] None  06/30/2024  1:10 PM Talynn Lebon, Rollo DASEN, MD [facility] None    Rollo DASEN Abigail, MD "

## 2024-05-23 ENCOUNTER — Encounter (HOSPITAL_COMMUNITY): Payer: Self-pay | Admitting: Obstetrics & Gynecology

## 2024-05-23 ENCOUNTER — Other Ambulatory Visit: Payer: Self-pay

## 2024-05-23 ENCOUNTER — Inpatient Hospital Stay (HOSPITAL_COMMUNITY)
Admission: AD | Admit: 2024-05-23 | Discharge: 2024-05-23 | Disposition: A | Discharge status: Home / Self Care | Attending: Obstetrics & Gynecology | Admitting: Obstetrics & Gynecology

## 2024-05-23 DIAGNOSIS — R102 Pelvic and perineal pain unspecified side: Secondary | ICD-10-CM | POA: Diagnosis not present

## 2024-05-23 DIAGNOSIS — K219 Gastro-esophageal reflux disease without esophagitis: Secondary | ICD-10-CM | POA: Diagnosis not present

## 2024-05-23 DIAGNOSIS — O26893 Other specified pregnancy related conditions, third trimester: Secondary | ICD-10-CM

## 2024-05-23 DIAGNOSIS — O23593 Infection of other part of genital tract in pregnancy, third trimester: Secondary | ICD-10-CM | POA: Diagnosis not present

## 2024-05-23 DIAGNOSIS — B3731 Acute candidiasis of vulva and vagina: Secondary | ICD-10-CM | POA: Diagnosis not present

## 2024-05-23 DIAGNOSIS — O212 Late vomiting of pregnancy: Secondary | ICD-10-CM | POA: Diagnosis not present

## 2024-05-23 DIAGNOSIS — O99613 Diseases of the digestive system complicating pregnancy, third trimester: Secondary | ICD-10-CM | POA: Diagnosis not present

## 2024-05-23 DIAGNOSIS — Z3A32 32 weeks gestation of pregnancy: Secondary | ICD-10-CM | POA: Diagnosis not present

## 2024-05-23 DIAGNOSIS — O2343 Unspecified infection of urinary tract in pregnancy, third trimester: Secondary | ICD-10-CM | POA: Insufficient documentation

## 2024-05-23 DIAGNOSIS — N949 Unspecified condition associated with female genital organs and menstrual cycle: Secondary | ICD-10-CM

## 2024-05-23 DIAGNOSIS — Z79899 Other long term (current) drug therapy: Secondary | ICD-10-CM | POA: Diagnosis not present

## 2024-05-23 DIAGNOSIS — R112 Nausea with vomiting, unspecified: Secondary | ICD-10-CM

## 2024-05-23 LAB — URINALYSIS, ROUTINE W REFLEX MICROSCOPIC
Bilirubin Urine: NEGATIVE
Glucose, UA: 150 mg/dL — AB
Hgb urine dipstick: NEGATIVE
Ketones, ur: NEGATIVE mg/dL
Nitrite: NEGATIVE
Protein, ur: NEGATIVE mg/dL
Specific Gravity, Urine: 1.016 (ref 1.005–1.030)
pH: 7 (ref 5.0–8.0)

## 2024-05-23 LAB — WET PREP, GENITAL
Clue Cells Wet Prep HPF POC: NONE SEEN
Sperm: NONE SEEN
Trich, Wet Prep: NONE SEEN
WBC, Wet Prep HPF POC: >=10 — AB
Yeast Wet Prep HPF POC: NONE SEEN

## 2024-05-23 MED ORDER — TERCONAZOLE 0.4 % VA CREA: 1.0000 | TOPICAL_CREAM | Freq: Every day | VAGINAL | 0 refills | Status: AC

## 2024-05-23 MED ORDER — FAMOTIDINE 20 MG PO TABS: 20.0000 mg | ORAL_TABLET | Freq: Two times a day (BID) | ORAL | 1 refills | Status: AC

## 2024-05-23 NOTE — MAU Provider Note (Addendum)
 " History     [id number]  Arrival date and time: 05/23/24 0903    Chief Complaint  Patient presents with   Pelvic Pain   Vaginal Discharge   Back Pain   Nausea     HPI Ariel Golden is a 24 y.o. at [redacted]w[redacted]d by ultrasound with no significant past medical history, who presents for vaginal discharge, pelvic pain, and N/W.  Discharge started 3 days ago. It is white, copious, and malodorous. Associated with itching. Unsure about LOF. Endorses fetal movement. Denies fever, dysuria, hematuria, CTX, abdominal pain, and vaginal bleeding or spotting. Per chart review patient was recently started on Macrobid  for UTI treatment.  Pain in the lower abdomen that started a few days ago. Worse with leg raises, movement, radiates down the thigh and back, and subsides with rest. Denies fecal or urinary incontinence. Denies numbness and tingling down her legs.  Report issues with nausea and vomiting throughout pregnancy. Vomits once a day. Has not worsen recently. Associated with heartburn and feeling of food being stuck in her throat. Amendable to starting TUMS or Pepcid .      Vaginal bleeding: No LOF: Unsure Fetal Movement: Yes Contractions: No  O/Positive/-- (08/12 1459)  OB History     Gravida  1   Para      Term      Preterm      AB      Living  0      SAB      IAB      Ectopic      Multiple      Live Births              Past Medical History:  Diagnosis Date   Medical history non-contributory     Past Surgical History:  Procedure Laterality Date   WISDOM TOOTH EXTRACTION  2020    Family History  Problem Relation Age of Onset   Diabetes Mother    Diabetes Maternal Grandmother    Kidney disease Maternal Grandmother    Stroke Maternal Grandfather    Heart disease Maternal Grandfather    Varicose Veins Maternal Grandfather    Hyperlipidemia Maternal Grandfather     Social History   Socioeconomic History   Marital status: Single    Spouse name: Not  on file   Number of children: Not on file   Years of education: Not on file   Highest education level: Not on file  Occupational History   Occupation: Adult Nurse  Tobacco Use   Smoking status: Never   Smokeless tobacco: Never  Vaping Use   Vaping status: Never Used  Substance and Sexual Activity   Alcohol use: Not Currently   Drug use: Never   Sexual activity: Yes  Other Topics Concern   Not on file  Social History Narrative   Not on file   Social Drivers of Health   Tobacco Use: Low Risk (05/23/2024)   Patient History    Smoking Tobacco Use: Never    Smokeless Tobacco Use: Never    Passive Exposure: Not on file  Financial Resource Strain: Not on file  Food Insecurity: No Food Insecurity (01/10/2022)   Received from [facility]   Hunger Vital Sign    Worried About Running Out of Food in the Last Year: Never true    Ran Out of Food in the Last Year: Never true  Transportation Needs: No Transportation Needs (01/10/2022)   Received from [facility] - Transportation  Lack of Transportation (Medical): No    Lack of Transportation (Non-Medical): No  Physical Activity: Not on file  Stress: Not on file  Social Connections: Not on file  Intimate Partner Violence: Not on file  Depression (PHQ2-9): High Risk (04/14/2024)   Depression (PHQ2-9)    PHQ-2 Score: 12  Alcohol Screen: Not on file  Housing: Not on file  Utilities: Not At Risk (01/10/2022)   Received from [facility]   [facility] Utilities    Threatened with loss of utilities: No  Health Literacy: Not on file    Allergies[1]  Medications Ordered Prior to Encounter[2]   Review of Systems  Constitutional: Negative.   HENT: Negative.    Eyes: Negative.   Respiratory: Negative.    Cardiovascular: Negative.   Gastrointestinal:  Positive for heartburn, nausea and vomiting. Negative for abdominal pain.  Genitourinary:  Negative for dysuria, flank pain, frequency, hematuria and urgency.   Musculoskeletal:  Positive for back pain and joint pain. Negative for falls.  Skin: Negative.   Neurological:  Positive for dizziness.  Endo/Heme/Allergies: Negative.   Psychiatric/Behavioral: Negative.     Pertinent positives and negative per HPI, all others reviewed and negative  Physical Exam   BP 121/64   Pulse (!) 108   Temp 98.2 F (36.8 C) (Oral)   Resp 16   Ht 5' 5 (1.651 m)   Wt 87.1 kg   LMP 10/06/2023 Comment: May 2025  BMI 31.95 kg/m   Patient Vitals for the past 24 hrs:  BP Temp Temp src Pulse Resp Height Weight  05/23/24 0936 121/64 98.2 F (36.8 C) Oral (!) 108 16 5' 5 (1.651 m) 87.1 kg    Physical Exam Constitutional:      General: She is not in acute distress.    Appearance: Normal appearance. She is not ill-appearing.  HENT:     Head: Normocephalic and atraumatic.     Mouth/Throat:     Mouth: Mucous membranes are moist.  Eyes:     Pupils: Pupils are equal, round, and reactive to light.  Cardiovascular:     Rate and Rhythm: Normal rate and regular rhythm.  Pulmonary:     Effort: Pulmonary effort is normal.  Abdominal:     Tenderness: There is no abdominal tenderness. There is no right CVA tenderness or left CVA tenderness.  Genitourinary:    General: Normal vulva.     Vagina: Vaginal discharge present.     Comments: White copious discharge in vaginal vault. No cervical erythema. Neurological:     General: No focal deficit present.     Mental Status: She is alert.     Motor: No weakness.     Cervical Exam  0 cm, thick, membranes intact  FHT Baseline 150, moderate variability, accels present, no decels Toco: none Cat: 1  Labs Results for orders placed or performed during the hospital encounter of 05/23/24 (from the past 24 hours)  Urinalysis, Routine w reflex microscopic -Urine, Clean Catch     Status: Abnormal   Collection Time: 05/23/24 10:28 AM  Result Value Ref Range   Color, Urine YELLOW YELLOW   APPearance CLOUDY (A) CLEAR    Specific Gravity, Urine 1.016 1.005 - 1.030   pH 7.0 5.0 - 8.0   Glucose, UA 150 (A) NEGATIVE mg/dL   Hgb urine dipstick NEGATIVE NEGATIVE   Bilirubin Urine NEGATIVE NEGATIVE   Ketones, ur NEGATIVE NEGATIVE mg/dL   Protein, ur NEGATIVE NEGATIVE mg/dL   Nitrite NEGATIVE NEGATIVE   Leukocytes,Ua  SMALL (A) NEGATIVE   RBC / HPF 0-5 0 - 5 RBC/hpf   WBC, UA 0-5 0 - 5 WBC/hpf   Bacteria, UA RARE (A) NONE SEEN   Squamous Epithelial / HPF 0-5 0 - 5 /HPF   Mucus PRESENT   Wet prep, genital     Status: Abnormal   Collection Time: 05/23/24 10:45 AM  Result Value Ref Range   Yeast Wet Prep HPF POC NONE SEEN NONE SEEN   Trich, Wet Prep NONE SEEN NONE SEEN   Clue Cells Wet Prep HPF POC NONE SEEN NONE SEEN   WBC, Wet Prep HPF POC >=10 (A) <10   Sperm NONE SEEN     Imaging No results found.  MAU Course  Procedures  Lab Orders         Wet prep, genital         Urinalysis, Routine w reflex microscopic -Urine, Clean Catch    No orders of the defined types were placed in this encounter.  Imaging Orders  No imaging studies ordered today    Assessment and Plan  Ariel Golden is a 24 y.o. at [redacted]w[redacted]d by ultrasound with no significant past medical history, who presents for vaginal discharge, pelvic pain, and N/V.  Vaginal yeast Infection  White copious vaginal discharge associated with itching in the setting of antibiotics use suggestive of yeast infection. Low concern for chlamydia, gonorrhea, and trichomas given that the discharge is more characteristic of a yeast infection. Patient is afebrile. Membranes are intact. Fetal tracing are reassuring and reactive. Will initiate terazole for seven days. Patient was advised to follow up with outpatient OB provider  Round ligament Pain Recent pelvic pain that worsen with movement, radiates to the thigh and back, and elicited by active straight leg and FABER test on exam suggestive of Round ligament pain. Patient has no neurological symptoms. Advise  to take tylenol if symptoms worsen and follow up with outpatient OB providers.  GERD Nausea and vomiting associated with heartburn and dysphagia suggestive of GERD. Patient is afebrile; hence, low suspicions for an infectious etiology. Patient was advised to use TUMS for symptomatic management and pepcid  if refractory.   Return precautions were provided to patient and was advised to follow instructions in discharged summary.    #FWB FHT Cat 1 NST: reactive   Dispo: discharged to home in stable condition.   Eliezer Dickens, Medical Student/MPH 05/23/24 10:46 AM  Allergies as of 05/23/2024   No Known Allergies      Medication List     TAKE these medications    aspirin  EC 81 MG tablet Take 1 tablet (81 mg total) by mouth at bedtime. Start taking when you are [redacted] weeks pregnant for rest of pregnancy for prevention of preeclampsia   famotidine  20 MG tablet Commonly known as: PEPCID  Take 1 tablet (20 mg total) by mouth 2 (two) times daily.   Prenatal Adult Gummy/DHA/FA 0.4-25 MG Chew Chew 1 Units by mouth daily.   terconazole 0.4 % vaginal cream Commonly known as: TERAZOL 7 Place 1 applicator vaginally at bedtime.         Attestation of Supervision of Student:  I confirm that I have verified the information documented in the medical students note and that I have also personally reperformed the history, physical exam and all medical decision making activities.  I have verified that all services and findings are accurately documented in this student's note; and I agree with management and plan as outlined in the documentation.  I have also made any necessary editorial changes.  See my note for official documentation.    Delon Emms, NP [facility] [employer], [facility] 05/23/2024 12:13 PM     [1] No Known Allergies [2]  No current facility-administered medications on file prior to encounter.   Current Outpatient Medications on File Prior to  Encounter  Medication Sig Dispense Refill   aspirin  EC 81 MG tablet Take 1 tablet (81 mg total) by mouth at bedtime. Start taking when you are [redacted] weeks pregnant for rest of pregnancy for prevention of preeclampsia 300 tablet 2   Prenatal MV & Min w/FA-DHA (PRENATAL ADULT GUMMY/DHA/FA) 0.4-25 MG CHEW Chew 1 Units by mouth daily. 90 tablet 2   "

## 2024-05-23 NOTE — MAU Provider Note (Signed)
 " History     CSN: [id number]  Arrival date and time: 05/23/24 0903   Event Date/Time   First Provider Initiated Contact with Patient 05/23/24 1019      Chief Complaint  Patient presents with   Pelvic Pain   Vaginal Discharge   Back Pain   Nausea    Ariel Golden is a 24 y.o. at [redacted]w[redacted]d by ultrasound with no significant past medical history, who presents for vaginal discharge, pelvic pain, and N/W.  Discharge started 3 days ago. It is white, copious, and malodorous. Associated with itching. Unsure about LOF. Endorses fetal movement. Denies fever, dysuria, hematuria, CTX, abdominal pain, and vaginal bleeding or spotting. Per chart review patient was recently started on Macrobid  for UTI treatment.  Pain in the lower abdomen that started a few days ago. Worse with leg raises, movement, radiates down the thigh and back, and subsides with rest. Denies fecal or urinary incontinence. Denies numbness and tingling down her legs.  Report issues with nausea and vomiting throughout pregnancy. Vomits once a day. Has not worsen recently. Associated with heartburn and feeling of food being stuck in her throat. Amendable to starting TUMS or Pepcid .      Vaginal bleeding: No LOF: Unsure Fetal Movement: Yes Contractions: No  O/Positive/-- (08/12 1459)  Past Medical History:  Diagnosis Date   Medical history non-contributory     Past Surgical History:  Procedure Laterality Date   WISDOM TOOTH EXTRACTION  2020    Family History  Problem Relation Age of Onset   Diabetes Mother    Diabetes Maternal Grandmother    Kidney disease Maternal Grandmother    Stroke Maternal Grandfather    Heart disease Maternal Grandfather    Varicose Veins Maternal Grandfather    Hyperlipidemia Maternal Grandfather     Social History[1]  Allergies: Allergies[2]  No medications prior to admission.    Results for orders placed or performed during the hospital encounter of 05/23/24 (from the past 48  hours)  Urinalysis, Routine w reflex microscopic -Urine, Clean Catch     Status: Abnormal   Collection Time: 05/23/24 10:28 AM  Result Value Ref Range   Color, Urine YELLOW YELLOW   APPearance CLOUDY (A) CLEAR   Specific Gravity, Urine 1.016 1.005 - 1.030   pH 7.0 5.0 - 8.0   Glucose, UA 150 (A) NEGATIVE mg/dL   Hgb urine dipstick NEGATIVE NEGATIVE   Bilirubin Urine NEGATIVE NEGATIVE   Ketones, ur NEGATIVE NEGATIVE mg/dL   Protein, ur NEGATIVE NEGATIVE mg/dL   Nitrite NEGATIVE NEGATIVE   Leukocytes,Ua SMALL (A) NEGATIVE   RBC / HPF 0-5 0 - 5 RBC/hpf   WBC, UA 0-5 0 - 5 WBC/hpf   Bacteria, UA RARE (A) NONE SEEN   Squamous Epithelial / HPF 0-5 0 - 5 /HPF   Mucus PRESENT     Comment: Performed at [facility], [address] [place], [place] [address]  Wet prep, genital     Status: Abnormal   Collection Time: 05/23/24 10:45 AM  Result Value Ref Range   Yeast Wet Prep HPF POC NONE SEEN NONE SEEN   Trich, Wet Prep NONE SEEN NONE SEEN   Clue Cells Wet Prep HPF POC NONE SEEN NONE SEEN   WBC, Wet Prep HPF POC >=10 (A) <10   Sperm NONE SEEN     Comment: Performed at [facility], [address] [place], [place] [address]      Review of Systems  Gastrointestinal:  Positive for abdominal pain, nausea and vomiting.  Genitourinary:  Positive for vaginal discharge. Negative for vaginal bleeding.   Physical Exam   Blood pressure 115/68, pulse 93, temperature 98.2 F (36.8 C), temperature source Oral, resp. rate 16, height 5' 5 (1.651 m), weight 87.1 kg, last menstrual period 10/06/2023.  Physical Exam Constitutional:      General: She is not in acute distress.    Appearance: Normal appearance. She is not ill-appearing, toxic-appearing or diaphoretic.  HENT:     Head: Normocephalic.  Genitourinary:    Comments: Vagina - Large amount of white/thick vaginal discharge, no odor. Consistent with vaginal yeast.  Cervix - No contact bleeding, no active bleeding  Bimanual  exam: Cervix closed, thick, posterior  GC/Chlam, wet prep done Chaperone present for exam.   Musculoskeletal:        General: Normal range of motion.  Skin:    General: Skin is warm.  Neurological:     Mental Status: She is alert and oriented to person, place, and time.  Psychiatric:        Behavior: Behavior normal.    Fetal Tracing: Baseline: 145 bpm Variability: Moderate  Accelerations: 15x15 Decelerations: None Toco: None  MAU Course  Procedures  MDM  Exam consistent with vaginal yeast Gc pending   Assessment and Plan   1. Vaginal yeast infection   2. Round ligament pain   3. [redacted] weeks gestation of pregnancy   4. Nausea and vomiting, unspecified vomiting type       P:  Dc home Rx: Terazol, pepcid  Return to MAU if symptoms    Zavien Clubb, Delon FERNS, NP 05/23/2024 12:17 PM     [1]  Social History Tobacco Use   Smoking status: Never   Smokeless tobacco: Never  Vaping Use   Vaping status: Never Used  Substance Use Topics   Alcohol use: Not Currently   Drug use: Never  [2] No Known Allergies  "

## 2024-05-23 NOTE — MAU Note (Signed)
 Ariel Golden is a 25 y.o. at [redacted]w[redacted]d here in MAU reporting: increase in vaginal discharge that started 2 days ago. White thick discharge. Some vaginal itching and odor present. Pelvic started a few days ago sharp pains that are worse with movement. Nausea ongoing throughout entire pregnancy but seems to be every day again, has not vomited today. Lower mid back pain that is constant that feels tight, sore and sharp. Denies any pain with urination  but an increase in frequency. Has not taken any medication, usually she rests and that helps.  Onset of complaint: ongoing Pain score: pelvic 3  back 3 Vitals:   05/23/24 0936  BP: 121/64  Pulse: (!) 108  Resp: 16  Temp: 98.2 F (36.8 C)     FHT:160 Lab orders placed from triage:  UA

## 2024-05-24 LAB — GC/CHLAMYDIA PROBE AMP (~~LOC~~) NOT AT ARMC
Chlamydia: NEGATIVE
Comment: NEGATIVE
Comment: NORMAL
Neisseria Gonorrhea: NEGATIVE

## 2024-05-26 ENCOUNTER — Encounter: Admitting: Obstetrics and Gynecology

## 2024-05-27 ENCOUNTER — Ambulatory Visit

## 2024-05-27 VITALS — BP 116/65 | HR 102 | Wt 201.0 lb

## 2024-05-27 DIAGNOSIS — O99210 Obesity complicating pregnancy, unspecified trimester: Secondary | ICD-10-CM

## 2024-05-27 DIAGNOSIS — Z3A33 33 weeks gestation of pregnancy: Secondary | ICD-10-CM | POA: Diagnosis not present

## 2024-05-27 DIAGNOSIS — Z1331 Encounter for screening for depression: Secondary | ICD-10-CM

## 2024-05-27 DIAGNOSIS — Z3400 Encounter for supervision of normal first pregnancy, unspecified trimester: Secondary | ICD-10-CM

## 2024-05-27 NOTE — Patient Instructions (Signed)

## 2024-05-27 NOTE — Progress Notes (Signed)
 "   LOW-RISK PREGNANCY OFFICE VISIT  Patient name: Ariel Golden MRN [id number]  Date of birth: 03-Sep-2000 Chief Complaint:   Routine Prenatal Visit  Subjective:   Ariel Golden is a 24 y.o. 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  Patient presents today, alone, with no complaints.  Patient endorses fetal movement. Patient reports abdominal cramping and contractions and thinks it was related to [facility].  She states they started 3 days ago and occurs twice per hour lasting one minute.   Patient reports recent treatment for yeast infection and denies itching.  She denies other vaginal concerns including abnormal discharge, leaking of fluid, and bleeding. No issues with urination, constipation, or diarrhea.    Contractions: Irritability. Vag. Bleeding: None.  Movement: Present.  Reviewed past medical,surgical, social, obstetrical and family history as well as problem list, medications and allergies.  Objective   Vitals:   05/27/24 1351  BP: 116/65  Pulse: (!) 102  Weight: 201 lb (91.2 kg)  Body mass index is 33.45 kg/m.  Total Weight Gain:16 lb (7.258 kg)         Physical Examination:   General appearance: Well appearing, and in no distress  Mental status: Alert, oriented to person, place, and time  Skin: Warm & dry  Cardiovascular: Normal heart rate noted  Respiratory: Normal respiratory effort, no distress  Abdomen: Soft, gravid, nontender, AGA with Fundal Height: 34 cm  Pelvic: Cervical exam deferred           Extremities: Edema: None  Fetal Status: Fetal Heart Rate (bpm): 152  Movement: Present    No results found for this or any previous visit (from the past 24 hours).   PHQ 2 & 9 Depression Scale- Over the past 2 weeks, how often have you been bothered by any of the following problems? Little interest or pleasure in doing things: 2 Feeling down, depressed, or hopeless (PHQ Adolescent also includes...irritable): 2 PHQ-2 Total Score: 4 Trouble falling or staying asleep, or sleeping too much: 3 Feeling tired or having little energy: 2 Poor appetite or overeating (PHQ Adolescent also includes...weight loss): 1 Feeling bad about yourself - or that you are a failure or have let yourself or your family down: 0 Trouble concentrating on things, such as reading the newspaper or watching television (PHQ Adolescent also includes...like school work): 1 Moving or speaking so slowly that other people could have noticed. Or the opposite - being so fidgety or restless that you have been moving around a lot more than usual: 1 Thoughts that you would be better off dead, or of hurting yourself in some way: 0 PHQ-9 Total Score: 12        04/14/2024    8:47 AM 01/07/2024    9:58 AM 06/12/2023    9:08 AM  GAD 7 : Generalized Anxiety Score  Nervous, Anxious, on Edge 3  2  1    Control/stop worrying 0  2  0   Worry too much - different things 2  2  1    Trouble relaxing 1  2  0   Restless 0  2  0   Easily annoyed or irritable 3  2  1  Afraid - awful might happen 1  2  0   Total GAD 7 Score 10 14 3      Data saved with a previous flowsheet row definition   Assessment & Plan   Low-risk pregnancy of a 24 y.o., G1P0 at [redacted]w[redacted]d with an Estimated Date of Delivery: 07/12/24   1. Supervision of normal first pregnancy, antepartum -Anticipatory guidance for upcoming appts. -Patient to schedule next appt in 2 weeks for an in-person visit. -Briefly discussed GBS culture collection b/t 36-37 weeks.   2. [redacted] weeks gestation of pregnancy -Doing well. -Discussed CB class.  Scheduled for this weekend. Missed  first session d/t weather.  -Considering pills for BCM.  Information placed in AVS.  -Discussed pediatricians. List placed in AVS.   3. Obesity during pregnancy -TWG 16lbs -BMI 33.45 today -Taking bASA as prescribed.   4. Positive screening for depression on 9-item Patient Health Questionnaire (PHQ-9) -Elevated in past. -Denies current feelings of depression/anxiety.  -Endorses safety at home. No HI/SI thoughts.       Meds: No orders of the defined types were placed in this encounter.  Labs/procedures today:  Lab Orders  No laboratory test(s) ordered today     Reviewed: Preterm labor symptoms and general obstetric precautions including but not limited to vaginal bleeding, contractions, leaking of fluid and fetal movement were reviewed in detail with the patient.  All questions were answered.  Follow-up: Return in about 2 weeks (around 06/10/2024).  No orders of the defined types were placed in this encounter.  Harlene LITTIE Duncans MSN, CNM 05/27/2024  "

## 2024-06-04 ENCOUNTER — Encounter (HOSPITAL_COMMUNITY): Payer: Self-pay | Admitting: Obstetrics & Gynecology

## 2024-06-04 ENCOUNTER — Other Ambulatory Visit: Payer: Self-pay

## 2024-06-04 ENCOUNTER — Inpatient Hospital Stay (HOSPITAL_COMMUNITY)
Admission: AD | Admit: 2024-06-04 | Discharge: 2024-06-04 | Disposition: A | Discharge status: Home / Self Care | Payer: Self-pay | Attending: Obstetrics & Gynecology | Admitting: Obstetrics & Gynecology

## 2024-06-04 DIAGNOSIS — O99713 Diseases of the skin and subcutaneous tissue complicating pregnancy, third trimester: Secondary | ICD-10-CM | POA: Insufficient documentation

## 2024-06-04 DIAGNOSIS — L259 Unspecified contact dermatitis, unspecified cause: Secondary | ICD-10-CM | POA: Insufficient documentation

## 2024-06-04 DIAGNOSIS — Z3689 Encounter for other specified antenatal screening: Secondary | ICD-10-CM | POA: Insufficient documentation

## 2024-06-04 DIAGNOSIS — Z3A34 34 weeks gestation of pregnancy: Secondary | ICD-10-CM | POA: Insufficient documentation

## 2024-06-04 MED ORDER — DIPHENHYDRAMINE HCL 25 MG PO CAPS
50.0000 mg | ORAL_CAPSULE | Freq: Once | ORAL | Status: AC
  Administered 2024-06-04: 50 mg via ORAL
  Filled 2024-06-04: qty 2

## 2024-06-04 MED ORDER — DIPHENHYDRAMINE HCL 50 MG PO TABS: 50.0000 mg | ORAL_TABLET | Freq: Four times a day (QID) | ORAL | 0 refills | Status: AC | PRN

## 2024-06-04 NOTE — MAU Note (Signed)
 Ariel Golden is a 24 y.o. at [redacted]w[redacted]d here in MAU reporting: she has a rash on her breast and abdomen that was noted a few days ago.  Reports rash is itchy, using cold compresses and anti itch cream, states some relief noted.  Reports hasn't changed detergents but started using a new moisturizing cream.   Denies VB and LOF.  Endorses +FM.  Reports my bellybutton is sore. LMP: 10/06/2023 Onset of complaint: 1 week Pain score: 6 Vitals:   06/04/24 1802  BP: 112/63  Pulse: (!) 112  Resp: 20  Temp: 98.7 F (37.1 C)  SpO2: 99%     FHT: 148 bpm  Lab orders placed from triage: None

## 2024-06-04 NOTE — Discharge Instructions (Signed)

## 2024-06-04 NOTE — MAU Provider Note (Signed)
 Chief Complaint:  Rash   HPI    Ariel Golden is a 24 y.o. G1P0 at [redacted]w[redacted]d who presents to maternity admissions reporting patient reports that she has a rash that started on her breast and abdomen that was noted a few days ago.  She reports it was getting worse she is use cool compresses and some over-the-counter hydrocortisone with mild relief noted.  Patient denies taking any antihistamines or other medication.  She denies any changes in detergents or soaps but reports she started using a new moisturizing cream I started using a Shea butter  She denies any obstetrical complaints.  No vaginal bleeding, leaking of fluid, reports good fetal movements..   Pregnancy Course: [place]  Available prenatal records reviewed  Past Medical History:  Diagnosis Date   Medical history non-contributory    OB History  Gravida Para Term Preterm AB Living  1     0  SAB IAB Ectopic Multiple Live Births          # Outcome Date GA Lbr Len/2nd Weight Sex Type Anes PTL Lv  1 Current            Past Surgical History:  Procedure Laterality Date   WISDOM TOOTH EXTRACTION  2020   Family History  Problem Relation Age of Onset   Diabetes Mother    Diabetes Maternal Grandmother    Kidney disease Maternal Grandmother    Stroke Maternal Grandfather    Heart disease Maternal Grandfather    Varicose Veins Maternal Grandfather    Hyperlipidemia Maternal Grandfather    Social History[1] Allergies[2] Medications Prior to Admission  Medication Sig Dispense Refill Last Dose/Taking   aspirin  EC 81 MG tablet Take 1 tablet (81 mg total) by mouth at bedtime. Start taking when you are [redacted] weeks pregnant for rest of pregnancy for prevention of preeclampsia 300 tablet 2 Past Week   Prenatal MV & Min w/FA-DHA (PRENATAL ADULT GUMMY/DHA/FA) 0.4-25 MG CHEW Chew 1 Units by mouth daily. 90 tablet 2 06/04/2024 at  2:00 PM   famotidine  (PEPCID ) 20 MG tablet Take 1 tablet (20 mg total) by mouth 2 (two) times daily. 60  tablet 1    terconazole  (TERAZOL 7 ) 0.4 % vaginal cream Place 1 applicator vaginally at bedtime. 45 g 0     I have reviewed patient's Past Medical Hx, Surgical Hx, Family Hx, Social Hx, medications and allergies.   ROS  Pertinent items noted in HPI and remainder of comprehensive ROS otherwise negative.   PHYSICAL EXAM  Patient Vitals for the past 24 hrs:  BP Temp Temp src Pulse Resp SpO2 Height Weight  06/04/24 1816 110/67 99 F (37.2 C) Oral 100 20 98 % -- --  06/04/24 1802 112/63 98.7 F (37.1 C) Oral (!) 112 20 99 % -- --  06/04/24 1755 -- -- -- -- -- -- 5' 5 (1.651 m) 90.1 kg    Constitutional: Well-developed, obese female in no acute distress.  Cardiovascular: tachycardic , warm and well-perfused Respiratory: normal effort, no problems with respiration noted GI: Abd soft, non-tender, gravid (non generalized  erythremic areas that are visualized, no lesions, no blisters, non papular, slightly raised on the abdomen)  MS: Extremities nontender, no edema, normal ROM Neurologic: Alert and oriented x 4.  Pelvic: deferred     Fetal Tracing: Reactive  Baseline: 130-135 Variability: moderate  Accelerations: present Decelerations: absent Toco: UI    MDM & MAU COURSE  MDM:  LOW  Prenatal chart reviewed Physical exam performed  NST for gestational age and fetal reassurance (category 1 reactive) Benadryl  50 mg p.o. x 1 Plan for discharge home with precautions and a list of safe medications in pregnancy Diagnosis is likely contact dermatitis  MAU Course:  Meds ordered this encounter  Medications   diphenhydrAMINE  (BENADRYL ) capsule 50 mg   diphenhydrAMINE  (BENADRYL ) 50 MG tablet    Sig: Take 1 tablet (50 mg total) by mouth every 6 (six) hours as needed for itching.    Dispense:  30 tablet    Refill:  0    Supervising Provider:   PRATT, TANYA S [[id number]]    I have reviewed the patient chart and performed the physical exam . I have ordered & interpreted the lab results  and reviewed and interpreted the NST    ASSESSMENT   1. Contact dermatitis, unspecified contact dermatitis type, unspecified trigger   2. [redacted] weeks gestation of pregnancy   3. NST (non-stress test) reactive on fetal surveillance     PLAN  Discharge home in stable condition with return precautions.   Safe list of medications in pregnancy provided  See AVS for full description of information given to the patient including both verbal and written. Patient verbalized understanding and agrees with the plan as described above.     Follow-up Information     [facility] [place] Follow up.   Specialty: Obstetrics and Gynecology Why: If symptoms worsen or fail to resolve, As scheduled for ongoing prenatal care Contact information: [address] [place] [employer]  [address] [phone]                Allergies as of 06/04/2024   No Known Allergies      Medication List     TAKE these medications    aspirin  EC 81 MG tablet Take 1 tablet (81 mg total) by mouth at bedtime. Start taking when you are [redacted] weeks pregnant for rest of pregnancy for prevention of preeclampsia   diphenhydrAMINE  50 MG tablet Commonly known as: BENADRYL  Take 1 tablet (50 mg total) by mouth every 6 (six) hours as needed for itching.   famotidine  20 MG tablet Commonly known as: PEPCID  Take 1 tablet (20 mg total) by mouth 2 (two) times daily.   Prenatal Adult Gummy/DHA/FA 0.4-25 MG Chew Chew 1 Units by mouth daily.   terconazole  0.4 % vaginal cream Commonly known as: TERAZOL 7  Place 1 applicator vaginally at bedtime.        Ariel Dalton, MSN, WHNP-BC [facility] [employer] [facility]      [1]  Social History Tobacco Use   Smoking status: Never   Smokeless tobacco: Never  Vaping Use   Vaping status: Never Used  Substance Use Topics   Alcohol use: Not Currently   Drug use: Never  [2] No Known  Allergies

## 2024-06-09 ENCOUNTER — Encounter: Admitting: Obstetrics & Gynecology

## 2024-06-23 ENCOUNTER — Encounter: Admitting: Obstetrics and Gynecology

## 2024-06-30 ENCOUNTER — Encounter: Admitting: Obstetrics and Gynecology
